# Patient Record
Sex: Female | Born: 1945 | Race: White | Hispanic: No | Smoking: Never smoker
Health system: Southern US, Community
[De-identification: ages and names within clinical notes are randomized; demographics above are authoritative.]

## PROBLEM LIST (undated history)

## (undated) DIAGNOSIS — I10 Essential (primary) hypertension: Secondary | ICD-10-CM

## (undated) DIAGNOSIS — K219 Gastro-esophageal reflux disease without esophagitis: Secondary | ICD-10-CM

## (undated) DIAGNOSIS — M810 Age-related osteoporosis without current pathological fracture: Secondary | ICD-10-CM

## (undated) DIAGNOSIS — I739 Peripheral vascular disease, unspecified: Secondary | ICD-10-CM

## (undated) DIAGNOSIS — Z9889 Other specified postprocedural states: Secondary | ICD-10-CM

## (undated) DIAGNOSIS — F439 Reaction to severe stress, unspecified: Secondary | ICD-10-CM

## (undated) DIAGNOSIS — E785 Hyperlipidemia, unspecified: Secondary | ICD-10-CM

## (undated) DIAGNOSIS — R112 Nausea with vomiting, unspecified: Secondary | ICD-10-CM

## (undated) HISTORY — DX: Gastro-esophageal reflux disease without esophagitis: K21.9

## (undated) HISTORY — PX: PARTIAL HYSTERECTOMY: SHX80

## (undated) HISTORY — DX: Hyperlipidemia, unspecified: E78.5

## (undated) HISTORY — PX: BREAST EXCISIONAL BIOPSY: SUR124

## (undated) HISTORY — DX: Essential (primary) hypertension: I10

## (undated) HISTORY — PX: BREAST BIOPSY: SHX20

## (undated) HISTORY — PX: DILATION AND CURETTAGE, DIAGNOSTIC / THERAPEUTIC: SUR384

## (undated) HISTORY — PX: APPENDECTOMY: SHX54

## (undated) HISTORY — DX: Age-related osteoporosis without current pathological fracture: M81.0

## (undated) HISTORY — DX: Reaction to severe stress, unspecified: F43.9

## (undated) HISTORY — PX: CHOLECYSTECTOMY: SHX55

## (undated) HISTORY — PX: TOTAL KNEE ARTHROPLASTY: SHX125

## (undated) HISTORY — DX: Peripheral vascular disease, unspecified: I73.9

---

## 2003-06-12 ENCOUNTER — Emergency Department (HOSPITAL_COMMUNITY): Admission: EM | Admit: 2003-06-12 | Discharge: 2003-06-12 | Payer: Self-pay | Admitting: Emergency Medicine

## 2003-07-22 ENCOUNTER — Emergency Department (HOSPITAL_COMMUNITY): Admission: EM | Admit: 2003-07-22 | Discharge: 2003-07-22 | Payer: Self-pay | Admitting: Emergency Medicine

## 2005-10-30 ENCOUNTER — Inpatient Hospital Stay (HOSPITAL_COMMUNITY): Admission: RE | Admit: 2005-10-30 | Discharge: 2005-11-03 | Payer: Self-pay | Admitting: Orthopedic Surgery

## 2013-01-14 ENCOUNTER — Ambulatory Visit: Payer: Medicare PPO | Admitting: Physical Therapy

## 2014-06-17 ENCOUNTER — Encounter: Payer: Self-pay | Admitting: *Deleted

## 2014-06-29 ENCOUNTER — Other Ambulatory Visit (HOSPITAL_COMMUNITY): Payer: Self-pay | Admitting: Family Medicine

## 2014-06-29 DIAGNOSIS — Z1231 Encounter for screening mammogram for malignant neoplasm of breast: Secondary | ICD-10-CM

## 2014-07-03 ENCOUNTER — Ambulatory Visit (HOSPITAL_COMMUNITY)
Admission: RE | Admit: 2014-07-03 | Discharge: 2014-07-03 | Disposition: A | Discharge status: Home / Self Care | Payer: Medicare PPO | Source: Ambulatory Visit | Attending: Family Medicine | Admitting: Family Medicine

## 2014-07-03 DIAGNOSIS — Z1231 Encounter for screening mammogram for malignant neoplasm of breast: Secondary | ICD-10-CM | POA: Diagnosis present

## 2014-09-01 DIAGNOSIS — E780 Pure hypercholesterolemia: Secondary | ICD-10-CM | POA: Diagnosis not present

## 2014-09-01 DIAGNOSIS — K219 Gastro-esophageal reflux disease without esophagitis: Secondary | ICD-10-CM | POA: Diagnosis not present

## 2014-09-01 DIAGNOSIS — I10 Essential (primary) hypertension: Secondary | ICD-10-CM | POA: Diagnosis not present

## 2014-10-05 DIAGNOSIS — R7309 Other abnormal glucose: Secondary | ICD-10-CM | POA: Diagnosis not present

## 2014-12-15 ENCOUNTER — Other Ambulatory Visit: Payer: Self-pay

## 2014-12-15 DIAGNOSIS — Z1231 Encounter for screening mammogram for malignant neoplasm of breast: Secondary | ICD-10-CM

## 2015-07-06 ENCOUNTER — Ambulatory Visit
Admission: RE | Admit: 2015-07-06 | Discharge: 2015-07-06 | Disposition: A | Discharge status: Home / Self Care | Payer: Medicare Other | Source: Ambulatory Visit

## 2015-07-06 DIAGNOSIS — Z1231 Encounter for screening mammogram for malignant neoplasm of breast: Secondary | ICD-10-CM

## 2016-08-16 ENCOUNTER — Other Ambulatory Visit: Payer: Self-pay | Admitting: Family Medicine

## 2016-08-16 DIAGNOSIS — Z1231 Encounter for screening mammogram for malignant neoplasm of breast: Secondary | ICD-10-CM

## 2016-09-04 ENCOUNTER — Ambulatory Visit
Admission: RE | Admit: 2016-09-04 | Discharge: 2016-09-04 | Disposition: A | Discharge status: Home / Self Care | Payer: Medicare Other | Source: Ambulatory Visit | Attending: Family Medicine | Admitting: Family Medicine

## 2016-09-04 DIAGNOSIS — Z1231 Encounter for screening mammogram for malignant neoplasm of breast: Secondary | ICD-10-CM

## 2017-10-10 ENCOUNTER — Other Ambulatory Visit: Payer: Self-pay | Admitting: Family Medicine

## 2017-10-10 DIAGNOSIS — Z1231 Encounter for screening mammogram for malignant neoplasm of breast: Secondary | ICD-10-CM

## 2017-11-09 ENCOUNTER — Ambulatory Visit
Admission: RE | Admit: 2017-11-09 | Discharge: 2017-11-09 | Disposition: A | Discharge status: Home / Self Care | Payer: Medicare Other | Source: Ambulatory Visit | Attending: Family Medicine | Admitting: Family Medicine

## 2017-11-09 DIAGNOSIS — Z1231 Encounter for screening mammogram for malignant neoplasm of breast: Secondary | ICD-10-CM

## 2018-01-07 ENCOUNTER — Other Ambulatory Visit: Payer: Self-pay | Admitting: Surgical

## 2018-01-07 DIAGNOSIS — M25551 Pain in right hip: Secondary | ICD-10-CM

## 2018-02-01 ENCOUNTER — Ambulatory Visit
Admission: RE | Admit: 2018-02-01 | Discharge: 2018-02-01 | Disposition: A | Discharge status: Home / Self Care | Payer: Medicare Other | Source: Ambulatory Visit | Attending: Surgical | Admitting: Surgical

## 2018-02-01 DIAGNOSIS — M25551 Pain in right hip: Secondary | ICD-10-CM

## 2018-02-01 MED ORDER — METHYLPREDNISOLONE ACETATE 40 MG/ML INJ SUSP (RADIOLOG
120.0000 mg | Freq: Once | INTRAMUSCULAR | Status: AC
  Administered 2018-02-01: 120 mg via INTRA_ARTICULAR

## 2018-02-01 MED ORDER — IOPAMIDOL (ISOVUE-M 200) INJECTION 41%
1.0000 mL | Freq: Once | INTRAMUSCULAR | Status: AC
  Administered 2018-02-01: 1 mL via INTRA_ARTICULAR

## 2018-02-01 NOTE — Discharge Instructions (Signed)

## 2018-08-26 NOTE — H&P (Signed)
TOTAL HIP ADMISSION H&P  Patient is admitted for right total hip arthroplasty.  Subjective:  Chief Complaint: right hip pain  HPI: Lisa Ward, 73 y.o. female, has a history of pain and functional disability in the right hip(s) due to arthritis and patient has failed non-surgical conservative treatments for greater than 12 weeks to include corticosteriod injections, use of assistive devices and activity modification.  Onset of symptoms was gradual starting 6 years ago with gradually worsening course since that time.The patient noted no past surgery on the right hip(s).  Patient currently rates pain in the right hip at 10 out of 10 with activity. Patient has worsening of pain with activity and weight bearing, pain that interfers with activities of daily living and instability. 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 She has erosion of some of the femoral head and some of the acetabulum, there are subchondral cysts present by imaging studies. This condition presents safety issues increasing the risk of falls. There is no current active infection.  There are no active problems to display for this patient.  Past Medical History:  Diagnosis Date  . Esophageal reflux   . HLD (hyperlipidemia)   . HTN (hypertension)   . Osteoporosis   . Situational stress     Past Surgical History:  Procedure Laterality Date  . APPENDECTOMY    . BREAST BIOPSY Left   . BREAST EXCISIONAL BIOPSY Left    benign  . CESAREAN SECTION     with last pregnancy  . CHOLECYSTECTOMY    . DILATION AND CURETTAGE, DIAGNOSTIC / THERAPEUTIC     x2  . PARTIAL HYSTERECTOMY     ovaries intact  . TOTAL KNEE ARTHROPLASTY Right     No current facility-administered medications for this encounter.    No current outpatient medications on file.   Not on File  Social History   Tobacco Use  . Smoking status: Not on file  Substance Use Topics  . Alcohol use: Not on  file    Family History  Problem Relation Age of Onset  . Hypertension Mother   . Dementia Mother   . Heart disease Father   . Lung cancer Father   . Heart disease Unknown   . Diabetes Unknown   . Cancer Unknown   . Arthritis Unknown      Review of Systems  Constitutional: Negative for chills and fever.  HENT: Negative for congestion, sore throat and tinnitus.   Eyes: Negative for double vision, photophobia and pain.  Respiratory: Negative for cough, shortness of breath and wheezing.   Cardiovascular: Negative for chest pain, palpitations and orthopnea.  Gastrointestinal: Negative for heartburn, nausea and vomiting.  Genitourinary: Negative for dysuria, frequency and urgency.  Musculoskeletal: Positive for joint pain.  Neurological: Negative for dizziness, weakness and headaches.    Objective:  Physical Exam  Well nourished and well developed.  General: Alert and oriented x3, cooperative and pleasant, no acute distress.  Head: normocephalic, atraumatic, neck supple.  Eyes: EOMI.  Respiratory: breath sounds clear in all fields, no wheezing, rales, or rhonchi. Cardiovascular: Regular rate and rhythm, no murmurs, gallops or rubs.  Abdomen: non-tender to palpation and soft, normoactive bowel sounds. Musculoskeletal:  Right Hip Exam: The range of motion: She has essentially almost no movement. Flexion to 90 degrees, Internal Rotation 0 degrees, External Rotation to 0 degrees, and abduction to 0 degrees with discomfort.   Calves soft and nontender. Motor function intact in  LE. Strength 5/5 LE bilaterally. Neuro: Distal pulses 2+. Sensation to light touch intact in LE.   Vital signs in last 24 hours: Blood pressure: 116/84 mmHg Pulse: 84 bpm  Labs:   There is no height or weight on file to calculate BMI.   Imaging Review Plain radiographs demonstrate severe degenerative joint disease of the right hip(s). The bone quality appears to be adequate for age and reported  activity level.   Assessment/Plan:  End stage arthritis, right hip(s)  The patient history, physical examination, clinical judgement of the provider and imaging studies are consistent with end stage degenerative joint disease of the right hip(s) and total hip arthroplasty is deemed medically necessary. The treatment options including medical management, injection therapy, arthroscopy and arthroplasty were discussed at length. The risks and benefits of total hip arthroplasty were presented and reviewed. The risks due to aseptic loosening, infection, stiffness, dislocation/subluxation,  thromboembolic complications and other imponderables were discussed.  The patient acknowledged the explanation, agreed to proceed with the plan and consent was signed. Patient is being admitted for inpatient treatment for surgery, pain control, PT, OT, prophylactic antibiotics, VTE prophylaxis, progressive ambulation and ADL's and discharge planning.The patient is planning to be discharged home.   Anticipated LOS equal to or greater than 2 midnights due to - Age 75 and older with one or more of the following:  - Obesity  - Expected need for hospital services (PT, OT, Nursing) required for safe  discharge  - Anticipated need for postoperative skilled nursing care or inpatient rehab  - Active co-morbidities: None OR   - Unanticipated findings during/Post Surgery: None  - Patient is a high risk of re-admission due to: None    Therapy Plans: HEP Disposition: Home with daughter-in-law Planned DVT Prophylaxis: Aspirin 325 mg BID DME needed: None PCP: Merri Brunetteandace Smith, MD TXA: IV Allergies: NKDA Anesthesia Concerns: Nausea/vomiting BMI: 32.3 Other: Pt has appointment with Dr. Katrinka BlazingSmith on 07/21 for preoperative clearance.  - Patient was instructed on what medications to stop prior to surgery. - Follow-up visit in 2 weeks with Dr. Lequita HaltAluisio - Begin physical therapy following surgery - Pre-operative lab work as  pre-surgical testing - Prescriptions will be provided in hospital at time of discharge  Arther AbbottKristie Iyonnah Ferrante, PA-C Orthopedic Surgery [facility] [place]

## 2018-09-07 ENCOUNTER — Other Ambulatory Visit (HOSPITAL_COMMUNITY)
Admission: RE | Admit: 2018-09-07 | Discharge: 2018-09-07 | Disposition: A | Discharge status: Home / Self Care | Payer: Medicare Other | Source: Ambulatory Visit | Attending: Orthopedic Surgery | Admitting: Orthopedic Surgery

## 2018-09-07 DIAGNOSIS — Z01812 Encounter for preprocedural laboratory examination: Secondary | ICD-10-CM | POA: Insufficient documentation

## 2018-09-07 DIAGNOSIS — Z20828 Contact with and (suspected) exposure to other viral communicable diseases: Secondary | ICD-10-CM | POA: Insufficient documentation

## 2018-09-07 LAB — SARS CORONAVIRUS 2 (TAT 6-24 HRS): SARS Coronavirus 2: NEGATIVE

## 2018-09-09 ENCOUNTER — Other Ambulatory Visit (HOSPITAL_COMMUNITY): Payer: Self-pay | Admitting: *Deleted

## 2018-09-09 NOTE — Patient Instructions (Addendum)
YOU HAD  A COVID 19 TEST ON 09-07-2018. ONCE YOUR COVID TEST IS COMPLETED, PLEASE BEGIN THE QUARANTINE INSTRUCTIONS AS OUTLINED IN YOUR HANDOUT.                 Lisa Ward     Your procedure is scheduled on: 09-11-2018   Report to [facility] Main  Entrance    Report to admitting at 11:10 AM   1 VISITOR IS ALLOWED TO WAIT IN WAITING ROOM  ONLY DAY OF YOUR SURGERY.    Call this number if you have problems the morning of surgery [phone]    Remember: [place], NO CHEWING GUM CANDY OR MINTS.    NO SOLID FOOD AFTER MIDNIGHT THE NIGHT PRIOR TO SURGERY. NOTHING BY MOUTH EXCEPT CLEAR LIQUIDS UNTIL 10:40 AM.  PLEASE FINISH ENSURE DRINK PER SURGEON ORDER 3 HOURS PRIOR TO SCHEDULED SURGERY TIME WHICH NEEDS TO BE COMPLETED AT 10:40 AM.    CLEAR LIQUID DIET   Foods Allowed                                                                     Foods Excluded  Coffee and tea, regular and decaf                             liquids that you cannot  Plain Jell-O any favor except red or purple                                           see through such as: Fruit ices (not with fruit pulp)                                     milk, soups, orange juice  Iced Popsicles                                    All solid food Carbonated beverages, regular and diet                                    Cranberry, grape and apple juices Sports drinks like Gatorade Lightly seasoned clear broth or consume(fat free) Sugar, honey syrup  Sample Menu Breakfast                                Lunch                                     Supper Cranberry juice                    Beef broth  Chicken broth Jell-O                                     Grape juice                           Apple juice Coffee or tea                        Jell-O                                      Popsicle                                                 Coffee or tea                        Coffee or tea  _____________________________________________________________________     Take these medicines the morning of surgery with A SIP OF WATER:  OMEPRAZOLE, ATORVASTATIN, TYLENOL IF NEEDED                                 You may not have any metal on your body including hair pins and              piercings  Do not wear jewelry, make-up, lotions, powders or perfumes, deodorant             Do not wear nail polish.  Do not shave  48 hours prior to surgery.               Do not bring valuables to the hospital. [facility] IS NOT             RESPONSIBLE   FOR VALUABLES.  Contacts, dentures or bridgework may not be worn into surgery.  Leave suitcase in the car. After surgery it may be brought to your room.                 Please read over the following fact sheets you were given: _____________________________________________________________________             [facility] - Preparing for Surgery Before surgery, you can play an important role.  Because skin is not sterile, your skin needs to be as free of germs as possible.  You can reduce the number of germs on your skin by washing with CHG (chlorahexidine gluconate) soap before surgery.  CHG is an antiseptic cleaner which kills germs and bonds with the skin to continue killing germs even after washing. Please DO NOT use if you have an allergy to CHG or antibacterial soaps.  If your skin becomes reddened/irritated stop using the CHG and inform your nurse when you arrive at Short Stay. Do not shave (including legs and underarms) for at least 48 hours prior to the first CHG shower.  You may shave your face/neck. Please follow these instructions carefully:  1.  Shower with CHG Soap the night before surgery and the  morning of Surgery.  2.  If you choose to wash your  hair, wash your hair first as usual with your  normal  shampoo.  3.  After you shampoo, rinse your hair and body thoroughly to  remove the  shampoo.                           4.  Use CHG as you would any other liquid soap.  You can apply chg directly  to the skin and wash                       Gently with a scrungie or clean washcloth.  5.  Apply the CHG Soap to your body ONLY FROM THE NECK DOWN.   Do not use on face/ open                           Wound or open sores. Avoid contact with eyes, ears mouth and genitals (private parts).                       Wash face,  Genitals (private parts) with your normal soap.             6.  Wash thoroughly, paying special attention to the area where your surgery  will be performed.  7.  Thoroughly rinse your body with warm water from the neck down.  8.  DO NOT shower/wash with your normal soap after using and rinsing off  the CHG Soap.                9.  Pat yourself dry with a clean towel.            10.  Wear clean pajamas.            11.  Place clean sheets on your bed the night of your first shower and do not  sleep with pets. Day of Surgery : Do not apply any lotions/deodorants the morning of surgery.  Please wear clean clothes to the hospital/surgery center.  FAILURE TO FOLLOW THESE INSTRUCTIONS MAY RESULT IN THE CANCELLATION OF YOUR SURGERY PATIENT SIGNATURE_________________________________  NURSE SIGNATURE__________________________________  ________________________________________________________________________   Lisa MireIncentive Spirometer  An incentive spirometer is a tool that can help keep your lungs clear and active. This tool measures how well you are filling your lungs with each breath. Taking long deep breaths may help reverse or decrease the chance of developing breathing (pulmonary) problems (especially infection) following:  A long period of time when you are unable to move or be active. BEFORE THE PROCEDURE   If the spirometer includes an indicator to show your best effort, your nurse or respiratory therapist will set it to a desired goal.  If possible, sit  up straight or lean slightly forward. Try not to slouch.  Hold the incentive spirometer in an upright position. INSTRUCTIONS FOR USE  1. Sit on the edge of your bed if possible, or sit up as far as you can in bed or on a chair. 2. Hold the incentive spirometer in an upright position. 3. Breathe out normally. 4. Place the mouthpiece in your mouth and seal your lips tightly around it. 5. Breathe in slowly and as deeply as possible, raising the piston or the ball toward the top of the column. 6. Hold your breath for 3-5 seconds or for as long as possible. Allow the piston or ball to fall to the bottom of  the column. 7. Remove the mouthpiece from your mouth and breathe out normally. 8. Rest for a few seconds and repeat Steps 1 through 7 at least 10 times every 1-2 hours when you are awake. Take your time and take a few normal breaths between deep breaths. 9. The spirometer may include an indicator to show your best effort. Use the indicator as a goal to work toward during each repetition. 10. After each set of 10 deep breaths, practice coughing to be sure your lungs are clear. If you have an incision (the cut made at the time of surgery), support your incision when coughing by placing a pillow or rolled up towels firmly against it. Once you are able to get out of bed, walk around indoors and cough well. You may stop using the incentive spirometer when instructed by your caregiver.  RISKS AND COMPLICATIONS  Take your time so you do not get dizzy or light-headed.  If you are in pain, you may need to take or ask for pain medication before doing incentive spirometry. It is harder to take a deep breath if you are having pain. AFTER USE  Rest and breathe slowly and easily.  It can be helpful to keep track of a log of your progress. Your caregiver can provide you with a simple table to help with this. If you are using the spirometer at home, follow these instructions: SEEK MEDICAL CARE IF:   You are  having difficultly using the spirometer.  You have trouble using the spirometer as often as instructed.  Your pain medication is not giving enough relief while using the spirometer.  You develop fever of 100.5 F (38.1 C) or higher. SEEK IMMEDIATE MEDICAL CARE IF:   You cough up bloody sputum that had not been present before.  You develop fever of 102 F (38.9 C) or greater.  You develop worsening pain at or near the incision site. MAKE SURE YOU:   Understand these instructions.  Will watch your condition.  Will get help right away if you are not doing well or get worse. Document Released: 06/05/2006 Document Revised: 04/17/2011 Document Reviewed: 08/06/2006 ExitCare Patient Information 2014 [employer], [place].   ________________________________________________________________________  WHAT IS A BLOOD TRANSFUSION? Blood Transfusion Information  A transfusion is the replacement of blood or some of its parts. Blood is made up of multiple cells which provide different functions.  Red blood cells carry oxygen and are used for blood loss replacement.  White blood cells fight against infection.  Platelets control bleeding.  Plasma helps clot blood.  Other blood products are available for specialized needs, such as hemophilia or other clotting disorders. BEFORE THE TRANSFUSION  Who gives blood for transfusions?   Healthy volunteers who are fully evaluated to make sure their blood is safe. This is blood bank blood. Transfusion therapy is the safest it has ever been in the practice of medicine. Before blood is taken from a donor, a complete history is taken to make sure that person has no history of diseases nor engages in risky social behavior (examples are intravenous drug use or sexual activity with multiple partners). The donor's travel history is screened to minimize risk of transmitting infections, such as malaria. The donated blood is tested for signs of infectious diseases,  such as HIV and hepatitis. The blood is then tested to be sure it is compatible with you in order to minimize the chance of a transfusion reaction. If you or a relative donates blood, this is often  done in anticipation of surgery and is not appropriate for emergency situations. It takes many days to process the donated blood. RISKS AND COMPLICATIONS Although transfusion therapy is very safe and saves many lives, the main dangers of transfusion include:   Getting an infectious disease.  Developing a transfusion reaction. This is an allergic reaction to something in the blood you were given. Every precaution is taken to prevent this. The decision to have a blood transfusion has been considered carefully by your caregiver before blood is given. Blood is not given unless the benefits outweigh the risks. AFTER THE TRANSFUSION  Right after receiving a blood transfusion, you will usually feel much better and more energetic. This is especially true if your red blood cells have gotten low (anemic). The transfusion raises the level of the red blood cells which carry oxygen, and this usually causes an energy increase.  The nurse administering the transfusion will monitor you carefully for complications. HOME CARE INSTRUCTIONS  No special instructions are needed after a transfusion. You may find your energy is better. Speak with your caregiver about any limitations on activity for underlying diseases you may have. SEEK MEDICAL CARE IF:   Your condition is not improving after your transfusion.  You develop redness or irritation at the intravenous (IV) site. SEEK IMMEDIATE MEDICAL CARE IF:  Any of the following symptoms occur over the next 12 hours:  Shaking chills.  You have a temperature by mouth above 102 F (38.9 C), not controlled by medicine.  Chest, back, or muscle pain.  People around you feel you are not acting correctly or are confused.  Shortness of breath or difficulty  breathing.  Dizziness and fainting.  You get a rash or develop hives.  You have a decrease in urine output.  Your urine turns a dark color or changes to pink, red, or brown. Any of the following symptoms occur over the next 10 days:  You have a temperature by mouth above 102 F (38.9 C), not controlled by medicine.  Shortness of breath.  Weakness after normal activity.  The white part of the eye turns yellow (jaundice).  You have a decrease in the amount of urine or are urinating less often.  Your urine turns a dark color or changes to pink, red, or brown. Document Released: 01/21/2000 Document Revised: 04/17/2011 Document Reviewed: 09/09/2007 [facility] Patient Information 2014 [place], [place].  _______________________________________________________________________

## 2018-09-10 ENCOUNTER — Encounter (HOSPITAL_COMMUNITY)
Admission: RE | Admit: 2018-09-10 | Discharge: 2018-09-10 | Disposition: A | Discharge status: Home / Self Care | Payer: Medicare Other | Source: Ambulatory Visit | Attending: Orthopedic Surgery | Admitting: Orthopedic Surgery

## 2018-09-10 ENCOUNTER — Encounter (HOSPITAL_COMMUNITY): Payer: Self-pay

## 2018-09-10 ENCOUNTER — Other Ambulatory Visit: Payer: Self-pay

## 2018-09-10 HISTORY — DX: Nausea with vomiting, unspecified: R11.2

## 2018-09-10 HISTORY — DX: Other specified postprocedural states: Z98.890

## 2018-09-10 LAB — COMPREHENSIVE METABOLIC PANEL
ALT: 11 U/L (ref 0–44)
AST: 12 U/L — ABNORMAL LOW (ref 15–41)
Albumin: 4.2 g/dL (ref 3.5–5.0)
Alkaline Phosphatase: 74 U/L (ref 38–126)
Anion gap: 11 (ref 5–15)
BUN: 17 mg/dL (ref 8–23)
CO2: 27 mmol/L (ref 22–32)
Calcium: 10.2 mg/dL (ref 8.9–10.3)
Chloride: 100 mmol/L (ref 98–111)
Creatinine, Ser: 0.58 mg/dL (ref 0.44–1.00)
GFR calc Af Amer: >60 mL/min (ref >60)
GFR calc non Af Amer: >60 mL/min (ref >60)
Glucose, Bld: 108 mg/dL — ABNORMAL HIGH (ref 70–99)
Potassium: 3.3 mmol/L — ABNORMAL LOW (ref 3.5–5.1)
Sodium: 138 mmol/L (ref 135–145)
Total Bilirubin: 1.4 mg/dL — ABNORMAL HIGH (ref 0.3–1.2)
Total Protein: 8 g/dL (ref 6.5–8.1)

## 2018-09-10 LAB — CBC
HCT: 42.8 % (ref 36.0–46.0)
Hemoglobin: 14 g/dL (ref 12.0–15.0)
MCH: 29.9 pg (ref 26.0–34.0)
MCHC: 32.7 g/dL (ref 30.0–36.0)
MCV: 91.3 fL (ref 80.0–100.0)
Platelets: 316 10*3/uL (ref 150–400)
RBC: 4.69 MIL/uL (ref 3.87–5.11)
RDW: 14.2 % (ref 11.5–15.5)
WBC: 11 10*3/uL — ABNORMAL HIGH (ref 4.0–10.5)
nRBC: 0 % (ref 0.0–0.2)

## 2018-09-10 LAB — SURGICAL PCR SCREEN
MRSA, PCR: NEGATIVE
Staphylococcus aureus: NEGATIVE

## 2018-09-10 LAB — PROTIME-INR
INR: 0.9 (ref 0.8–1.2)
Prothrombin Time: 12.4 seconds (ref 11.4–15.2)

## 2018-09-10 LAB — APTT: aPTT: 30 seconds (ref 24–36)

## 2018-09-10 NOTE — Progress Notes (Signed)
SURGICAL CLEARANCE AND LOV NOTES , DR CANDACE SMITH ON CHART 08-27-2018  EKG 08-27-2018 ON CHART FROM [facility].   LABS :HGBA1C, CMP,CBCDIFF, LIPIDS 12-19-17 ON CHAR FROM [facility].

## 2018-09-11 ENCOUNTER — Encounter (HOSPITAL_COMMUNITY)
Admission: RE | Disposition: A | Discharge status: Home / Self Care | Payer: Self-pay | Source: Home / Self Care | Attending: Orthopedic Surgery

## 2018-09-11 ENCOUNTER — Encounter (HOSPITAL_COMMUNITY): Payer: Self-pay | Admitting: *Deleted

## 2018-09-11 ENCOUNTER — Inpatient Hospital Stay (HOSPITAL_COMMUNITY): Payer: Medicare Other

## 2018-09-11 ENCOUNTER — Inpatient Hospital Stay (HOSPITAL_COMMUNITY): Payer: Medicare Other | Admitting: Physician Assistant

## 2018-09-11 ENCOUNTER — Inpatient Hospital Stay (HOSPITAL_COMMUNITY)
Admission: RE | Admit: 2018-09-11 | Discharge: 2018-09-12 | DRG: 470 | Disposition: A | Discharge status: Home / Self Care | Payer: Medicare Other | Source: Ambulatory Visit | Attending: Orthopedic Surgery | Admitting: Orthopedic Surgery

## 2018-09-11 ENCOUNTER — Inpatient Hospital Stay (HOSPITAL_COMMUNITY): Payer: Medicare Other | Admitting: Certified Registered"

## 2018-09-11 DIAGNOSIS — M25551 Pain in right hip: Secondary | ICD-10-CM | POA: Diagnosis present

## 2018-09-11 DIAGNOSIS — Z8249 Family history of ischemic heart disease and other diseases of the circulatory system: Secondary | ICD-10-CM

## 2018-09-11 DIAGNOSIS — Z419 Encounter for procedure for purposes other than remedying health state, unspecified: Secondary | ICD-10-CM

## 2018-09-11 DIAGNOSIS — M1611 Unilateral primary osteoarthritis, right hip: Secondary | ICD-10-CM | POA: Diagnosis present

## 2018-09-11 DIAGNOSIS — M810 Age-related osteoporosis without current pathological fracture: Secondary | ICD-10-CM | POA: Diagnosis present

## 2018-09-11 DIAGNOSIS — K219 Gastro-esophageal reflux disease without esophagitis: Secondary | ICD-10-CM | POA: Diagnosis present

## 2018-09-11 DIAGNOSIS — E785 Hyperlipidemia, unspecified: Secondary | ICD-10-CM | POA: Diagnosis present

## 2018-09-11 DIAGNOSIS — Z20828 Contact with and (suspected) exposure to other viral communicable diseases: Secondary | ICD-10-CM | POA: Diagnosis present

## 2018-09-11 DIAGNOSIS — Z96651 Presence of right artificial knee joint: Secondary | ICD-10-CM | POA: Diagnosis present

## 2018-09-11 DIAGNOSIS — Z01812 Encounter for preprocedural laboratory examination: Secondary | ICD-10-CM

## 2018-09-11 DIAGNOSIS — I10 Essential (primary) hypertension: Secondary | ICD-10-CM | POA: Diagnosis present

## 2018-09-11 DIAGNOSIS — M169 Osteoarthritis of hip, unspecified: Secondary | ICD-10-CM | POA: Diagnosis present

## 2018-09-11 DIAGNOSIS — Z96649 Presence of unspecified artificial hip joint: Secondary | ICD-10-CM

## 2018-09-11 HISTORY — PX: TOTAL HIP ARTHROPLASTY: SHX124

## 2018-09-11 LAB — TYPE AND SCREEN
ABO/RH(D): A POS
Antibody Screen: NEGATIVE

## 2018-09-11 SURGERY — ARTHROPLASTY, HIP, TOTAL, ANTERIOR APPROACH
Anesthesia: Spinal | Site: Hip | Laterality: Right

## 2018-09-11 MED ORDER — ACETAMINOPHEN 160 MG/5ML PO SOLN: 325.0000 mg | ORAL | Status: DC | PRN

## 2018-09-11 MED ORDER — CHLORHEXIDINE GLUCONATE 4 % EX LIQD: 60.0000 mL | Freq: Once | CUTANEOUS | Status: DC

## 2018-09-11 MED ORDER — MORPHINE SULFATE (PF) 2 MG/ML IV SOLN: 0.5000 mg | INTRAVENOUS | Status: DC | PRN

## 2018-09-11 MED ORDER — PROPOFOL 10 MG/ML IV BOLUS
INTRAVENOUS | Status: AC
  Filled 2018-09-11: qty 60

## 2018-09-11 MED ORDER — CEFAZOLIN SODIUM-DEXTROSE 2-4 GM/100ML-% IV SOLN
2.0000 g | Freq: Four times a day (QID) | INTRAVENOUS | Status: AC
  Administered 2018-09-11 (×2): 2 g via INTRAVENOUS
  Filled 2018-09-11 (×2): qty 100

## 2018-09-11 MED ORDER — ACETAMINOPHEN 500 MG PO TABS
500.0000 mg | ORAL_TABLET | Freq: Four times a day (QID) | ORAL | Status: DC
  Administered 2018-09-11 – 2018-09-12 (×3): 500 mg via ORAL
  Filled 2018-09-11 (×3): qty 1

## 2018-09-11 MED ORDER — OXYCODONE HCL 5 MG/5ML PO SOLN: 5.0000 mg | Freq: Once | ORAL | Status: DC | PRN

## 2018-09-11 MED ORDER — ONDANSETRON HCL 4 MG/2ML IJ SOLN
INTRAMUSCULAR | Status: DC | PRN
  Administered 2018-09-11: 4 mg via INTRAVENOUS

## 2018-09-11 MED ORDER — METOCLOPRAMIDE HCL 5 MG PO TABS
5.0000 mg | ORAL_TABLET | Freq: Three times a day (TID) | ORAL | Status: DC | PRN
  Filled 2018-09-11: qty 2

## 2018-09-11 MED ORDER — BISACODYL 10 MG RE SUPP: 10.0000 mg | Freq: Every day | RECTAL | Status: DC | PRN

## 2018-09-11 MED ORDER — SODIUM CHLORIDE 0.9 % IV SOLN: INTRAVENOUS | Status: DC | PRN

## 2018-09-11 MED ORDER — ONDANSETRON HCL 4 MG/2ML IJ SOLN: 4.0000 mg | Freq: Four times a day (QID) | INTRAMUSCULAR | Status: DC | PRN

## 2018-09-11 MED ORDER — SODIUM CHLORIDE 0.9 % IV SOLN
INTRAVENOUS | Status: DC
  Administered 2018-09-11: 19:00:00 via INTRAVENOUS

## 2018-09-11 MED ORDER — FLEET ENEMA 7-19 GM/118ML RE ENEM: 1.0000 | ENEMA | Freq: Once | RECTAL | Status: DC | PRN

## 2018-09-11 MED ORDER — POVIDONE-IODINE 10 % EX SWAB: 2.0000 "application " | Freq: Once | CUTANEOUS | Status: DC

## 2018-09-11 MED ORDER — DIPHENHYDRAMINE HCL 12.5 MG/5ML PO ELIX: 12.5000 mg | ORAL_SOLUTION | ORAL | Status: DC | PRN

## 2018-09-11 MED ORDER — ASPIRIN EC 325 MG PO TBEC
325.0000 mg | DELAYED_RELEASE_TABLET | Freq: Two times a day (BID) | ORAL | Status: DC
  Administered 2018-09-12: 325 mg via ORAL
  Filled 2018-09-11: qty 1

## 2018-09-11 MED ORDER — MEPERIDINE HCL 50 MG/ML IJ SOLN: 6.2500 mg | INTRAMUSCULAR | Status: DC | PRN

## 2018-09-11 MED ORDER — DEXAMETHASONE SODIUM PHOSPHATE 10 MG/ML IJ SOLN
8.0000 mg | Freq: Once | INTRAMUSCULAR | Status: AC
  Administered 2018-09-11: 10 mg via INTRAVENOUS

## 2018-09-11 MED ORDER — ACETAMINOPHEN 10 MG/ML IV SOLN
1000.0000 mg | Freq: Once | INTRAVENOUS | Status: AC
  Administered 2018-09-11: 1000 mg via INTRAVENOUS
  Filled 2018-09-11: qty 100

## 2018-09-11 MED ORDER — PROPOFOL 500 MG/50ML IV EMUL
INTRAVENOUS | Status: DC | PRN
  Administered 2018-09-11: 25 ug/kg/min via INTRAVENOUS

## 2018-09-11 MED ORDER — ATORVASTATIN CALCIUM 40 MG PO TABS
40.0000 mg | ORAL_TABLET | Freq: Every day | ORAL | Status: DC
  Filled 2018-09-11: qty 1

## 2018-09-11 MED ORDER — LACTATED RINGERS IV SOLN
INTRAVENOUS | Status: DC
  Administered 2018-09-11 (×2): via INTRAVENOUS

## 2018-09-11 MED ORDER — 0.9 % SODIUM CHLORIDE (POUR BTL) OPTIME
TOPICAL | Status: DC | PRN
  Administered 2018-09-11: 1000 mL

## 2018-09-11 MED ORDER — POTASSIUM GLUCONATE 595 (99 K) MG PO TABS
1190.0000 mg | ORAL_TABLET | Freq: Every day | ORAL | Status: DC
  Administered 2018-09-12: 1190 mg via ORAL
  Filled 2018-09-11: qty 2

## 2018-09-11 MED ORDER — OXYCODONE HCL 5 MG PO TABS: 5.0000 mg | ORAL_TABLET | Freq: Once | ORAL | Status: DC | PRN

## 2018-09-11 MED ORDER — PROPOFOL 10 MG/ML IV BOLUS
INTRAVENOUS | Status: DC | PRN
  Administered 2018-09-11: 30 mg via INTRAVENOUS
  Administered 2018-09-11: 25 mg via INTRAVENOUS

## 2018-09-11 MED ORDER — BUPIVACAINE-EPINEPHRINE (PF) 0.25% -1:200000 IJ SOLN
INTRAMUSCULAR | Status: AC
  Filled 2018-09-11: qty 30

## 2018-09-11 MED ORDER — FENTANYL CITRATE (PF) 100 MCG/2ML IJ SOLN: 25.0000 ug | INTRAMUSCULAR | Status: DC | PRN

## 2018-09-11 MED ORDER — LIDOCAINE 2% (20 MG/ML) 5 ML SYRINGE
INTRAMUSCULAR | Status: DC | PRN
  Administered 2018-09-11 (×2): 50 mg via INTRAVENOUS

## 2018-09-11 MED ORDER — PHENOL 1.4 % MT LIQD: 1.0000 | OROMUCOSAL | Status: DC | PRN

## 2018-09-11 MED ORDER — POLYETHYLENE GLYCOL 3350 17 G PO PACK: 17.0000 g | PACK | Freq: Every day | ORAL | Status: DC | PRN

## 2018-09-11 MED ORDER — GLYCOPYRROLATE PF 0.2 MG/ML IJ SOSY
PREFILLED_SYRINGE | INTRAMUSCULAR | Status: DC | PRN
  Administered 2018-09-11 (×2): .1 mg via INTRAVENOUS

## 2018-09-11 MED ORDER — SODIUM CHLORIDE 0.9 % IV SOLN
INTRAVENOUS | Status: DC | PRN
  Administered 2018-09-11: 10 ug/min via INTRAVENOUS

## 2018-09-11 MED ORDER — METOCLOPRAMIDE HCL 5 MG/ML IJ SOLN: 5.0000 mg | Freq: Three times a day (TID) | INTRAMUSCULAR | Status: DC | PRN

## 2018-09-11 MED ORDER — HYDROCODONE-ACETAMINOPHEN 5-325 MG PO TABS
1.0000 | ORAL_TABLET | ORAL | Status: DC | PRN
  Administered 2018-09-11 – 2018-09-12 (×2): 2 via ORAL
  Filled 2018-09-11 (×2): qty 2

## 2018-09-11 MED ORDER — BUPIVACAINE IN DEXTROSE 0.75-8.25 % IT SOLN
INTRATHECAL | Status: DC | PRN
  Administered 2018-09-11: 1.8 mL via INTRATHECAL

## 2018-09-11 MED ORDER — TRANEXAMIC ACID-NACL 1000-0.7 MG/100ML-% IV SOLN
1000.0000 mg | INTRAVENOUS | Status: AC
  Administered 2018-09-11: 12:00:00 1000 mg via INTRAVENOUS
  Filled 2018-09-11: qty 100

## 2018-09-11 MED ORDER — PHENYLEPHRINE 40 MCG/ML (10ML) SYRINGE FOR IV PUSH (FOR BLOOD PRESSURE SUPPORT)
PREFILLED_SYRINGE | INTRAVENOUS | Status: DC | PRN
  Administered 2018-09-11: 80 ug via INTRAVENOUS
  Administered 2018-09-11 (×2): 120 ug via INTRAVENOUS

## 2018-09-11 MED ORDER — DOCUSATE SODIUM 100 MG PO CAPS
100.0000 mg | ORAL_CAPSULE | Freq: Two times a day (BID) | ORAL | Status: DC
  Administered 2018-09-11 – 2018-09-12 (×2): 100 mg via ORAL
  Filled 2018-09-11 (×2): qty 1

## 2018-09-11 MED ORDER — ALBUMIN HUMAN 5 % IV SOLN
INTRAVENOUS | Status: DC | PRN
  Administered 2018-09-11: 13:00:00 via INTRAVENOUS

## 2018-09-11 MED ORDER — CEFAZOLIN SODIUM-DEXTROSE 2-4 GM/100ML-% IV SOLN
2.0000 g | INTRAVENOUS | Status: AC
  Administered 2018-09-11: 2 g via INTRAVENOUS
  Filled 2018-09-11: qty 100

## 2018-09-11 MED ORDER — TRAMADOL HCL 50 MG PO TABS
50.0000 mg | ORAL_TABLET | Freq: Four times a day (QID) | ORAL | Status: DC | PRN
  Administered 2018-09-12: 06:00:00 50 mg via ORAL
  Filled 2018-09-11: qty 1

## 2018-09-11 MED ORDER — ACETAMINOPHEN 325 MG PO TABS: 325.0000 mg | ORAL_TABLET | ORAL | Status: DC | PRN

## 2018-09-11 MED ORDER — ONDANSETRON HCL 4 MG/2ML IJ SOLN: 4.0000 mg | Freq: Once | INTRAMUSCULAR | Status: DC | PRN

## 2018-09-11 MED ORDER — METHOCARBAMOL 500 MG PO TABS
500.0000 mg | ORAL_TABLET | Freq: Four times a day (QID) | ORAL | Status: DC | PRN
  Administered 2018-09-11 – 2018-09-12 (×2): 500 mg via ORAL
  Filled 2018-09-11 (×2): qty 1

## 2018-09-11 MED ORDER — BUPIVACAINE-EPINEPHRINE (PF) 0.25% -1:200000 IJ SOLN
INTRAMUSCULAR | Status: DC | PRN
  Administered 2018-09-11: 30 mL

## 2018-09-11 MED ORDER — DEXAMETHASONE SODIUM PHOSPHATE 10 MG/ML IJ SOLN
10.0000 mg | Freq: Once | INTRAMUSCULAR | Status: AC
  Administered 2018-09-12: 10 mg via INTRAVENOUS
  Filled 2018-09-11: qty 1

## 2018-09-11 MED ORDER — ONDANSETRON HCL 4 MG PO TABS
4.0000 mg | ORAL_TABLET | Freq: Four times a day (QID) | ORAL | Status: DC | PRN
  Filled 2018-09-11: qty 1

## 2018-09-11 MED ORDER — STERILE WATER FOR IRRIGATION IR SOLN
Status: DC | PRN
  Administered 2018-09-11: 2000 mL

## 2018-09-11 MED ORDER — MENTHOL 3 MG MT LOZG: 1.0000 | LOZENGE | OROMUCOSAL | Status: DC | PRN

## 2018-09-11 MED ORDER — METHOCARBAMOL 500 MG IVPB - SIMPLE MED
500.0000 mg | Freq: Four times a day (QID) | INTRAVENOUS | Status: DC | PRN
  Filled 2018-09-11: qty 50

## 2018-09-11 SURGICAL SUPPLY — 47 items
BAG DECANTER FOR FLEXI CONT (MISCELLANEOUS) IMPLANT
BAG ZIPLOCK 12X15 (MISCELLANEOUS) IMPLANT
BLADE SAG 18X100X1.27 (BLADE) ×3 IMPLANT
CLOSURE STERI-STRIP 1/2X4 (GAUZE/BANDAGES/DRESSINGS) ×2
CLOSURE WOUND 1/2 X4 (GAUZE/BANDAGES/DRESSINGS) ×1
CLSR STERI-STRIP ANTIMIC 1/2X4 (GAUZE/BANDAGES/DRESSINGS) ×4 IMPLANT
COVER PERINEAL POST (MISCELLANEOUS) ×3 IMPLANT
COVER SURGICAL LIGHT HANDLE (MISCELLANEOUS) ×3 IMPLANT
COVER WAND RF STERILE (DRAPES) IMPLANT
CUP ACETBLR 52 OD PINNACLE (Hips) ×3 IMPLANT
DECANTER SPIKE VIAL GLASS SM (MISCELLANEOUS) ×3 IMPLANT
DRAPE STERI IOBAN 125X83 (DRAPES) ×3 IMPLANT
DRAPE U-SHAPE 47X51 STRL (DRAPES) ×6 IMPLANT
DRSG ADAPTIC 3X8 NADH LF (GAUZE/BANDAGES/DRESSINGS) ×3 IMPLANT
DRSG MEPILEX BORDER 4X4 (GAUZE/BANDAGES/DRESSINGS) ×3 IMPLANT
DRSG MEPILEX BORDER 4X8 (GAUZE/BANDAGES/DRESSINGS) ×3 IMPLANT
DURAPREP 26ML APPLICATOR (WOUND CARE) ×3 IMPLANT
ELECT REM PT RETURN 15FT ADLT (MISCELLANEOUS) ×3 IMPLANT
EVACUATOR 1/8 PVC DRAIN (DRAIN) ×3 IMPLANT
GLOVE BIO SURGEON STRL SZ 6 (GLOVE) IMPLANT
GLOVE BIO SURGEON STRL SZ7 (GLOVE) IMPLANT
GLOVE BIO SURGEON STRL SZ8 (GLOVE) ×3 IMPLANT
GLOVE BIOGEL PI IND STRL 6.5 (GLOVE) IMPLANT
GLOVE BIOGEL PI IND STRL 7.0 (GLOVE) IMPLANT
GLOVE BIOGEL PI IND STRL 8 (GLOVE) ×1 IMPLANT
GLOVE BIOGEL PI INDICATOR 6.5 (GLOVE)
GLOVE BIOGEL PI INDICATOR 7.0 (GLOVE)
GLOVE BIOGEL PI INDICATOR 8 (GLOVE) ×2
GOWN STRL REUS W/TWL LRG LVL3 (GOWN DISPOSABLE) ×3 IMPLANT
GOWN STRL REUS W/TWL XL LVL3 (GOWN DISPOSABLE) IMPLANT
HEAD FEM STD 32X+1 STRL (Hips) ×3 IMPLANT
HOLDER FOLEY CATH W/STRAP (MISCELLANEOUS) ×3 IMPLANT
KIT TURNOVER KIT A (KITS) IMPLANT
LINER MARATHON NEUT +4X52X32 (Hips) ×3 IMPLANT
MANIFOLD NEPTUNE II (INSTRUMENTS) ×3 IMPLANT
PACK ANTERIOR HIP CUSTOM (KITS) ×3 IMPLANT
STEM FEM ACTIS HIGH SZ10 (Stem) ×3 IMPLANT
STRIP CLOSURE SKIN 1/2X4 (GAUZE/BANDAGES/DRESSINGS) ×2 IMPLANT
SUT ETHIBOND NAB CT1 #1 30IN (SUTURE) ×3 IMPLANT
SUT MNCRL AB 4-0 PS2 18 (SUTURE) ×3 IMPLANT
SUT STRATAFIX 0 PDS 27 VIOLET (SUTURE) ×3
SUT VIC AB 2-0 CT1 27 (SUTURE) ×4
SUT VIC AB 2-0 CT1 TAPERPNT 27 (SUTURE) ×2 IMPLANT
SUTURE STRATFX 0 PDS 27 VIOLET (SUTURE) ×1 IMPLANT
SYR 50ML LL SCALE MARK (SYRINGE) IMPLANT
TRAY CATH 16FR W/PLASTIC CATH (SET/KITS/TRAYS/PACK) ×3 IMPLANT
YANKAUER SUCT BULB TIP 10FT TU (MISCELLANEOUS) ×3 IMPLANT

## 2018-09-11 NOTE — Interval H&P Note (Signed)
History and Physical Interval Note:  09/11/2018 11:10 AM  Lisa Ward  has presented today for surgery, with the diagnosis of right hip osteoarthritis.  The various methods of treatment have been discussed with the patient and family. After consideration of risks, benefits and other options for treatment, the patient has consented to  Procedure(s) with comments: [place] (Right) - 155min as a surgical intervention.  The patient's history has been reviewed, patient examined, no change in status, stable for surgery.  I have reviewed the patient's chart and labs.  Questions were answered to the patient's satisfaction.     [name] [name]

## 2018-09-11 NOTE — Progress Notes (Signed)
Patient arrived to unit alert and oriented. Patient oriented to room and equipment. No complaints of pain. Will continue to monitor.

## 2018-09-11 NOTE — Transfer of Care (Signed)
Immediate Anesthesia Transfer of Care Note  Patient: Lisa Ward  Procedure(s) Performed: TOTAL HIP ARTHROPLASTY ANTERIOR APPROACH (Right Hip)  Patient Location: PACU  Anesthesia Type:MAC and Spinal  Level of Consciousness: awake and patient cooperative  Airway & Oxygen Therapy: Patient Spontanous Breathing and Patient connected to face mask oxygen  Post-op Assessment: Report given to RN and Post -op Vital signs reviewed and stable  Post vital signs: Reviewed and stable  Last Vitals:  Vitals Value Taken Time  BP 100/63 09/11/18 1408  Temp    Pulse 76 09/11/18 1412  Resp 17 09/11/18 1412  SpO2 100 % 09/11/18 1412  Vitals shown include unvalidated device data.  Last Pain:  Vitals:   09/11/18 1135  PainSc: 0-No pain         Complications: No apparent anesthesia complications

## 2018-09-11 NOTE — Discharge Instructions (Signed)
°Dr. Frank Aluisio °Total Joint Specialist °[facility] °[address] °[place],  [address] °[phone] ° °ANTERIOR APPROACH TOTAL HIP REPLACEMENT POSTOPERATIVE DIRECTIONS ° ° °Hip Rehabilitation, Guidelines Following Surgery  °The results of a hip operation are greatly improved after range of motion and muscle strengthening exercises. Follow all safety measures which are given to protect your hip. If any of these exercises cause increased pain or swelling in your joint, decrease the amount until you are comfortable again. Then slowly increase the exercises. Call your caregiver if you have problems or questions.  ° °HOME CARE INSTRUCTIONS  °• Remove items at home which could result in a fall. This includes throw rugs or furniture in walking pathways.  °· ICE to the affected hip every three hours for 30 minutes at a time and then as needed for pain and swelling.  Continue to use ice on the hip for pain and swelling from surgery. You may notice swelling that will progress down to the foot and ankle.  This is normal after surgery.  Elevate the leg when you are not up walking on it.   °· Continue to use the breathing machine which will help keep your temperature down.  It is common for your temperature to cycle up and down following surgery, especially at night when you are not up moving around and exerting yourself.  The breathing machine keeps your lungs expanded and your temperature down. ° °DIET °You may resume your previous home diet once your are discharged from the hospital. ° °DRESSING / WOUND CARE / SHOWERING °You may change your dressing 3-5 days after surgery.  Then change the dressing every day with sterile gauze.  Please use good hand washing techniques before changing the dressing.  Do not use any lotions or creams on the incision until instructed by your surgeon. °You may start showering once you are discharged home but do not submerge the incision under water. Just pat the  incision dry and apply a dry gauze dressing on daily. °Change the surgical dressing daily and reapply a dry dressing each time. ° °ACTIVITY °Walk with your walker as instructed. °Use walker as long as suggested by your caregivers. °Avoid periods of inactivity such as sitting longer than an hour when not asleep. This helps prevent blood clots.  °You may resume a sexual relationship in one month or when given the OK by your doctor.  °You may return to work once you are cleared by your doctor.  °Do not drive a car for 6 weeks or until released by you surgeon.  °Do not drive while taking narcotics. ° °WEIGHT BEARING °Weight bearing as tolerated with assist device (walker, cane, etc) as directed, use it as long as suggested by your surgeon or therapist, typically at least 4-6 weeks. ° °POSTOPERATIVE CONSTIPATION PROTOCOL °Constipation - defined medically as fewer than three stools per week and severe constipation as less than one stool per week. ° °One of the most common issues patients have following surgery is constipation.  Even if you have a regular bowel pattern at home, your normal regimen is likely to be disrupted due to multiple reasons following surgery.  Combination of anesthesia, postoperative narcotics, change in appetite and fluid intake all can affect your bowels.  In order to avoid complications following surgery, here are some recommendations in order to help you during your recovery period. ° °Colace (docusate) - Pick up an over-the-counter form of Colace or another stool softener and take twice a day   as long as you are requiring postoperative pain medications.  Take with a full glass of water daily.  If you experience loose stools or diarrhea, hold the colace until you stool forms back up.  If your symptoms do not get better within 1 week or if they get worse, check with your doctor. ° °Dulcolax (bisacodyl) - Pick up over-the-counter and take as directed by the product packaging as needed to assist with  the movement of your bowels.  Take with a full glass of water.  Use this product as needed if not relieved by Colace only.  ° °MiraLax (polyethylene glycol) - Pick up over-the-counter to have on hand.  MiraLax is a solution that will increase the amount of water in your bowels to assist with bowel movements.  Take as directed and can mix with a glass of water, juice, soda, coffee, or tea.  Take if you go more than two days without a movement. °Do not use MiraLax more than once per day. Call your doctor if you are still constipated or irregular after using this medication for 7 days in a row. ° °If you continue to have problems with postoperative constipation, please contact the office for further assistance and recommendations.  If you experience "the worst abdominal pain ever" or develop nausea or vomiting, please contact the office immediatly for further recommendations for treatment. ° °ITCHING ° If you experience itching with your medications, try taking only a single pain pill, or even half a pain pill at a time.  You can also use Benadryl over the counter for itching or also to help with sleep.  ° °TED HOSE STOCKINGS °Wear the elastic stockings on both legs for three weeks following surgery during the day but you may remove then at night for sleeping. ° °MEDICATIONS °See your medication summary on the “After Visit Summary” that the nursing staff will review with you prior to discharge.  You may have some home medications which will be placed on hold until you complete the course of blood thinner medication.  It is important for you to complete the blood thinner medication as prescribed by your surgeon.  Continue your approved medications as instructed at time of discharge. ° °PRECAUTIONS °If you experience chest pain or shortness of breath - call 911 immediately for transfer to the hospital emergency department.  °If you develop a fever greater that 101 F, purulent drainage from wound, increased redness or  drainage from wound, foul odor from the wound/dressing, or calf pain - CONTACT YOUR SURGEON.   °                                                °FOLLOW-UP APPOINTMENTS °Make sure you keep all of your appointments after your operation with your surgeon and caregivers. You should call the office at the above phone number and make an appointment for approximately two weeks after the date of your surgery or on the date instructed by your surgeon outlined in the "After Visit Summary". ° °RANGE OF MOTION AND STRENGTHENING EXERCISES  °These exercises are designed to help you keep full movement of your hip joint. Follow your caregiver's or physical therapist's instructions. Perform all exercises about fifteen times, three times per day or as directed. Exercise both hips, even if you have had only one joint replacement. These exercises can be done on   a training (exercise) mat, on the floor, on a table or on a bed. Use whatever works the best and is most comfortable for you. Use music or television while you are exercising so that the exercises are a pleasant break in your day. This will make your life better with the exercises acting as a break in routine you can look forward to.  °• Lying on your back, slowly slide your foot toward your buttocks, raising your knee up off the floor. Then slowly slide your foot back down until your leg is straight again.  °• Lying on your back spread your legs as far apart as you can without causing discomfort.  °• Lying on your side, raise your upper leg and foot straight up from the floor as far as is comfortable. Slowly lower the leg and repeat.  °• Lying on your back, tighten up the muscle in the front of your thigh (quadriceps muscles). You can do this by keeping your leg straight and trying to raise your heel off the floor. This helps strengthen the largest muscle supporting your knee.  °• Lying on your back, tighten up the muscles of your buttocks both with the legs straight and with  the knee bent at a comfortable angle while keeping your heel on the floor.  ° °IF YOU ARE TRANSFERRED TO A SKILLED REHAB FACILITY °If the patient is transferred to a skilled rehab facility following release from the hospital, a list of the current medications will be sent to the facility for the patient to continue.  When discharged from the skilled rehab facility, please have the facility set up the patient's Home Health Physical Therapy prior to being released. Also, the skilled facility will be responsible for providing the patient with their medications at time of release from the facility to include their pain medication, the muscle relaxants, and their blood thinner medication. If the patient is still at the rehab facility at time of the two week follow up appointment, the skilled rehab facility will also need to assist the patient in arranging follow up appointment in our office and any transportation needs. ° °MAKE SURE YOU:  °• Understand these instructions.  °• Get help right away if you are not doing well or get worse.  ° ° °Pick up stool softner and laxative for home use following surgery while on pain medications. °Do not submerge incision under water. °Please use good hand washing techniques while changing dressing each day. °May shower starting three days after surgery. °Please use a clean towel to pat the incision dry following showers. °Continue to use ice for pain and swelling after surgery. °Do not use any lotions or creams on the incision until instructed by your surgeon. ° °

## 2018-09-11 NOTE — Op Note (Signed)
OPERATIVE REPORT- TOTAL HIP ARTHROPLASTY   PREOPERATIVE DIAGNOSIS: Osteoarthritis of the Right hip.   POSTOPERATIVE DIAGNOSIS: Osteoarthritis of the Right  hip.   PROCEDURE: Right total hip arthroplasty, anterior approach.   SURGEON: Gaynelle Arabian, MD   ASSISTANT: Griffith Citron, PA-C  ANESTHESIA:  Spinal  ESTIMATED BLOOD LOSS:-375 mL    DRAINS: Hemovac x1.   COMPLICATIONS: None   CONDITION: PACU - hemodynamically stable.   BRIEF CLINICAL NOTE: Lisa Ward is a 73 y.o. female who has advanced end-  stage arthritis of their Right  hip with progressively worsening pain and  dysfunction.The patient has failed nonoperative management and presents for  total hip arthroplasty.   PROCEDURE IN DETAIL: After successful administration of spinal  anesthetic, the traction boots for the [facility] bed were placed on both  feet and the patient was placed onto the [facility] bed, boots placed into the leg  holders. The Right hip was then isolated from the perineum with plastic  drapes and prepped and draped in the usual sterile fashion. ASIS and  greater trochanter were marked and a oblique incision was made, starting  at about 1 cm lateral and 2 cm distal to the ASIS and coursing towards  the anterior cortex of the femur. The skin was cut with a 10 blade  through subcutaneous tissue to the level of the fascia overlying the  tensor fascia lata muscle. The fascia was then incised in line with the  incision at the junction of the anterior third and posterior 2/3rd. The  muscle was teased off the fascia and then the interval between the TFL  and the rectus was developed. The Hohmann retractor was then placed at  the top of the femoral neck over the capsule. The vessels overlying the  capsule were cauterized and the fat on top of the capsule was removed.  A Hohmann retractor was then placed anterior underneath the rectus  femoris to give exposure to the entire anterior capsule. A  T-shaped  capsulotomy was performed. The edges were tagged and the femoral head  was identified.       Osteophytes are removed off the superior acetabulum.  The femoral neck was then cut in situ with an oscillating saw. Traction  was then applied to the left lower extremity utilizing the [facility]  traction. The femoral head was then removed. Retractors were placed  around the acetabulum and then circumferential removal of the labrum was  performed. Osteophytes were also removed. Reaming starts at 49 mm to  medialize and  Increased in 2 mm increments to 51 mm. We reamed in  approximately 40 degrees of abduction, 20 degrees anteversion. A 52 mm  pinnacle acetabular shell was then impacted in anatomic position under  fluoroscopic guidance with excellent purchase. We did not need to place  any additional dome screws. A 32 mm neutral + 4 marathon liner was then  placed into the acetabular shell.       The femoral lift was then placed along the lateral aspect of the femur  just distal to the vastus ridge. The leg was  externally rotated and capsule  was stripped off the inferior aspect of the femoral neck down to the  level of the lesser trochanter, this was done with electrocautery. The femur was lifted after this was performed. The  leg was then placed in an extended and adducted position essentially delivering the femur. We also removed the capsule superiorly and the piriformis from the piriformis  fossa to gain excellent exposure of the  proximal femur. Rongeur was used to remove some cancellous bone to get  into the lateral portion of the proximal femur for placement of the  initial starter reamer. The starter broaches was placed  the starter broach  and was shown to go down the center of the canal. Broaching  with the Actis system was then performed starting at size 0  coursing  Up to size 10. A size 10 had excellent torsional and rotational  and axial stability. The trial high offset neck was  then placed  with a 32 + 1 trial head. The hip was then reduced. We confirmed that  the stem was in the canal both on AP and lateral x-rays. It also has excellent sizing. The hip was reduced with outstanding stability through full extension and full external rotation.. AP pelvis was taken and the leg lengths were measured and found to be equal. Hip was then dislocated again and the femoral head and neck removed. The  femoral broach was removed. Size 10 Actis stem with a high offset  neck was then impacted into the femur following native anteversion. Has  excellent purchase in the canal. Excellent torsional and rotational and  axial stability. It is confirmed to be in the canal on AP and lateral  fluoroscopic views. The 32 + 1 metal head was placed and the hip  reduced with outstanding stability. Again AP pelvis was taken and it  confirmed that the leg lengths were equal. The wound was then copiously  irrigated with saline solution and the capsule reattached and repaired  with Ethibond suture. 30 ml of .25% Bupivicaine was  injected into the capsule and into the edge of the tensor fascia lata as well as subcutaneous tissue. The fascia overlying the tensor fascia lata was then closed with a running #1 V-Loc. Subcu was closed with interrupted 2-0 Vicryl and subcuticular running 4-0 Monocryl. Incision was cleaned  and dried. Steri-Strips and a bulky sterile dressing applied. Hemovac  drain was hooked to suction and then the patient was awakened and transported to  recovery in stable condition.        Please note that a surgical assistant was a medical necessity for this procedure to perform it in a safe and expeditious manner. Assistant was necessary to provide appropriate retraction of vital neurovascular structures and to prevent femoral fracture and allow for anatomic placement of the prosthesis.  Lisa Ward, M.D.

## 2018-09-11 NOTE — Plan of Care (Signed)
  Problem: Acute Rehab PT Goals(only PT should resolve) Goal: Patient Will Transfer Sit To/From Stand Outcome: Progressing Flowsheets (Taken 09/11/2018 1836) Patient will transfer sit to/from stand: with supervision Goal: Pt Will Transfer Bed To Chair/Chair To Bed Outcome: Progressing Flowsheets (Taken 09/11/2018 1836) Pt will Transfer Bed to Chair/Chair to Bed: with supervision Goal: Pt Will Ambulate Outcome: Progressing Flowsheets (Taken 09/11/2018 1836) Pt will Ambulate:  > 125 feet  with supervision  with least restrictive assistive device Goal: Pt/caregiver will Perform Home Exercise Program Outcome: Progressing Flowsheets (Taken 09/11/2018 1836) Pt/caregiver will Perform Home Exercise Program:  For increased ROM  For increased strengthening  With Supervision, verbal cues required/provided

## 2018-09-11 NOTE — Anesthesia Postprocedure Evaluation (Signed)
Anesthesia Post Note  Patient: SHANEQUIA KENDRICK  Procedure(s) Performed: TOTAL HIP ARTHROPLASTY ANTERIOR APPROACH (Right Hip)     Patient location during evaluation: PACU Anesthesia Type: Spinal Level of consciousness: oriented and awake and alert Pain management: pain level controlled Vital Signs Assessment: post-procedure vital signs reviewed and stable Respiratory status: spontaneous breathing, respiratory function stable and patient connected to nasal cannula oxygen Cardiovascular status: blood pressure returned to baseline and stable Postop Assessment: no headache, no backache and no apparent nausea or vomiting Anesthetic complications: no    Last Vitals:  Vitals:   09/11/18 1530 09/11/18 1558  BP: 111/60 109/60  Pulse: 76 74  Resp: 16 16  Temp: 36.7 C (!) 36.4 C  SpO2: 95% 100%    Last Pain:  Vitals:   09/11/18 1558  TempSrc: Oral  PainSc:                  Curtiss Mahmood

## 2018-09-11 NOTE — Anesthesia Procedure Notes (Addendum)
Spinal  Patient location during procedure: OR Start time: 09/11/2018 12:18 PM End time: 09/11/2018 12:27 PM Staffing Anesthesiologist: Janeece Riggers, MD Resident/CRNA: Silas Sacramento, CRNA Performed: resident/CRNA  Preanesthetic Checklist Completed: patient identified, site marked, surgical consent, pre-op evaluation, timeout performed, IV checked, risks and benefits discussed and monitors and equipment checked Spinal Block Patient position: sitting Prep: DuraPrep Patient monitoring: heart rate, cardiac monitor, continuous pulse ox and blood pressure Approach: midline Location: L3-4 Injection technique: single-shot Needle Needle type: Sprotte  Needle gauge: 24 G Needle length: 9 cm Assessment Sensory level: T4 Additional Notes IV functioning, monitors applied to pt. Expiration date of kit checked and confirmed to be in date. Sterile prep and drape, hand hygiene and sterile gloved used. Pt was positioned and spine was prepped in sterile fashion. Skin was anesthetized with lidocaine. Free flow of clear CSF obtained prior to injecting local anesthetic into CSF x 1 attempt. Spinal needle aspirated freely following injection. Needle was carefully withdrawn, and pt tolerated procedure well. Loss of motor and sensory on exam post injection.

## 2018-09-11 NOTE — Anesthesia Preprocedure Evaluation (Addendum)
Anesthesia Evaluation  Patient identified by MRN, date of birth, ID band Patient awake    Reviewed: Allergy & Precautions, NPO status , Patient's Chart, lab work & pertinent test results  History of Anesthesia Complications (+) PONV and history of anesthetic complications  Airway Mallampati: II  TM Distance: >3 FB Neck ROM: Full    Dental no notable dental hx.    Pulmonary neg pulmonary ROS,    Pulmonary exam normal breath sounds clear to auscultation       Cardiovascular hypertension, Pt. on medications negative cardio ROS Normal cardiovascular exam Rhythm:Regular Rate:Normal  EKG 8/20 Sinus rhythm with 1st degree A-V block Left axis deviation Inferior infarct , age undetermined Possible Anterior infarct , age undetermined versus lead placement   Neuro/Psych negative neurological ROS  negative psych ROS   GI/Hepatic Neg liver ROS, GERD  Medicated,  Endo/Other  negative endocrine ROS  Renal/GU negative Renal ROS  negative genitourinary   Musculoskeletal  (+) Arthritis , Osteoarthritis,    Abdominal   Peds negative pediatric ROS (+)  Hematology negative hematology ROS (+)   Anesthesia Other Findings   Reproductive/Obstetrics negative OB ROS                             Anesthesia Physical Anesthesia Plan  ASA: II  Anesthesia Plan: Spinal   Post-op Pain Management:    Induction:   PONV Risk Score and Plan: 3 and Treatment may vary due to age or medical condition  Airway Management Planned: Nasal Cannula, Simple Face Mask and Natural Airway  Additional Equipment:   Intra-op Plan:   Post-operative Plan:   Informed Consent:   Plan Discussed with: Anesthesiologist, CRNA and Surgeon  Anesthesia Plan Comments: (  )       Anesthesia Quick Evaluation

## 2018-09-11 NOTE — Evaluation (Signed)
Physical Therapy Evaluation Patient Details Name: Lisa Ward MRN: [id number] DOB: [birth year] Today's Date: 09/11/2018   History of Present Illness  Lisa Ward, 73 y.o. female, has a history of pain and functional disability in the right hip(s) due to arthritis and patient has failed non-surgical conservative treatments for greater than 12 weeks to include corticosteriod injections, use of assistive devices and activity modification. Pt no s/p Rt THA anterior approach.    Clinical Impression  Lisa Ward is a 73 y.o. female POD 0 s/p Rt anterior THA. She is agreeable to participate in therapy and denies pain throughout session. Patient is limited by functional impairments listed below (see PT Problem List). She required min assist for bed mobility to manage Rt LE movement and min assist for transfers and gait this session. Patient was educated throughout gait on safe hand placement and proximity with RW as she has not used one previously. She has a good support system with her daughter in law and family and anticipate she will be safe to go home following additional therapy treatment and when medically ready. She will benefit from additional skilled Acute PT to progress independence with mobility in preparation for safe discharge home.    Follow Up Recommendations Follow surgeon's recommendation for DC plan and follow-up therapies(pt would benefit from HHPT as she reports balance difficulty leading up to surgery)    Equipment Recommendations  Rolling walker with 5" wheels    Recommendations for Other Services       Precautions / Restrictions Precautions Precautions: Fall Precaution Comments: Rt THA Ant approach Restrictions Weight Bearing Restrictions: No      Mobility  Bed Mobility Overal bed mobility: Needs Assistance Bed Mobility: Supine to Sit     Supine to sit: HOB elevated;Min assist     General bed mobility comments: HOB elevated, patient slow  and required assist for Rt LE mobility  Transfers Overall transfer level: Needs assistance Equipment used: Rolling walker (2 wheeled) Transfers: Sit to/from Stand Sit to Stand: Min assist         General transfer comment: cues for safe hand placement on RW and min assist to initiate power up  Ambulation/Gait Ambulation/Gait assistance: Min assist Gait Distance (Feet): 100 Feet Assistive device: Rolling walker (2 wheeled) Gait Pattern/deviations: Step-through pattern;Decreased step length - right;Decreased stride length;Trunk flexed Gait velocity: slight decrease   General Gait Details: pt with good steadiness using RW, decreased Rt hip flexion and knee flexion resulting in reduced step length  Stairs            Wheelchair Mobility    Modified Rankin (Stroke Patients Only)       Balance Overall balance assessment: Needs assistance Sitting-balance support: No upper extremity supported;Feet supported Sitting balance-Leahy Scale: Good       Standing balance-Leahy Scale: Fair Standing balance comment: pt able to maintain static standing but requires RW for UE support with dynamic standing and gait                             Pertinent Vitals/Pain Pain Assessment: No/denies pain    Home Living Family/patient expects to be discharged to:: Private residence Living Arrangements: Alone(pt's daughter-in-law is staying with her for 2 weeks) Available Help at Discharge: Family Type of Home: House Home Access: Ramped entrance     Home Layout: One level Home Equipment: Environmental consultantWalker - 4 wheels;Cane - single point;Bedside commode;Shower seat - built in;Shower seat;Grab bars -  tub/shower Additional Comments: pt has walk in shower with shower seat, pt has ramped entrance in the back of her house and does not use the front steps. she has a rollator that is ~ 26 years old and does not have any other walkers.    Prior Function Level of Independence: Independent with  assistive device(s)         Comments: pt was ambulating with rollator prior to surgery and ~ 5 weeks prior to that had been using a SPC for mobiltiy     Hand Dominance   Dominant Hand: Right    Extremity/Trunk Assessment   Upper Extremity Assessment Upper Extremity Assessment: Overall WFL for tasks assessed    Lower Extremity Assessment Lower Extremity Assessment: RLE deficits/detail RLE Deficits / Details: 3+/5 throughout, pt able limited greatest with hip flexion    Cervical / Trunk Assessment Cervical / Trunk Assessment: Kyphotic  Communication   Communication: No difficulties  Cognition Arousal/Alertness: Awake/alert Behavior During Therapy: WFL for tasks assessed/performed Overall Cognitive Status: Within Functional Limits for tasks assessed      General Comments      Exercises Total Joint Exercises Ankle Circles/Pumps: AROM;5 reps;Both;Supine Heel Slides: AROM;5 reps;Right;Supine Hip ABduction/ADduction: 5 reps;AROM;Right;Supine   Assessment/Plan    PT Assessment Patient needs continued PT services  PT Problem List Decreased strength;Decreased balance;Decreased mobility;Decreased activity tolerance;Decreased knowledge of use of DME       PT Treatment Interventions DME instruction;Functional mobility training;Balance training;Patient/family education;Modalities;Gait training;Therapeutic activities;Stair training;Therapeutic exercise;Manual techniques    PT Goals (Current goals can be found in the Care Plan section)  Acute Rehab PT Goals Patient Stated Goal: pt wants to go home tomorrow PT Goal Formulation: With patient/family Time For Goal Achievement: 09/14/18 Potential to Achieve Goals: Good    Frequency 7X/week    AM-PAC PT "6 Clicks" Mobility  Outcome Measure Help needed turning from your back to your side while in a flat bed without using bedrails?: A Little Help needed moving from lying on your back to sitting on the side of a flat bed  without using bedrails?: A Little Help needed moving to and from a bed to a chair (including a wheelchair)?: A Little Help needed standing up from a chair using your arms (e.g., wheelchair or bedside chair)?: A Little Help needed to walk in hospital room?: A Little Help needed climbing 3-5 steps with a railing? : A Little 6 Click Score: 18    End of Session Equipment Utilized During Treatment: Gait belt Activity Tolerance: Patient tolerated treatment well Patient left: in bed;with call bell/phone within reach;with family/visitor present;with nursing/sitter in room Nurse Communication: Mobility status PT Visit Diagnosis: Unsteadiness on feet (R26.81);Difficulty in walking, not elsewhere classified (R26.2);Muscle weakness (generalized) (M62.81)    Time: 3532-9924 PT Time Calculation (min) (ACUTE ONLY): 33 min   Charges:   PT Evaluation $PT Eval Low Complexity: 1 Low PT Treatments $Gait Training: 8-22 mins        Kipp Brood, PT, DPT, [facility] Physical Therapist with [facility]  09/11/2018 6:36 PM

## 2018-09-12 ENCOUNTER — Encounter (HOSPITAL_COMMUNITY): Payer: Self-pay | Admitting: Orthopedic Surgery

## 2018-09-12 LAB — CBC
HCT: 33.4 % — ABNORMAL LOW (ref 36.0–46.0)
Hemoglobin: 11 g/dL — ABNORMAL LOW (ref 12.0–15.0)
MCH: 30.4 pg (ref 26.0–34.0)
MCHC: 32.9 g/dL (ref 30.0–36.0)
MCV: 92.3 fL (ref 80.0–100.0)
Platelets: 244 10*3/uL (ref 150–400)
RBC: 3.62 MIL/uL — ABNORMAL LOW (ref 3.87–5.11)
RDW: 13.9 % (ref 11.5–15.5)
WBC: 14.1 10*3/uL — ABNORMAL HIGH (ref 4.0–10.5)
nRBC: 0 % (ref 0.0–0.2)

## 2018-09-12 LAB — BASIC METABOLIC PANEL
Anion gap: 8 (ref 5–15)
BUN: 12 mg/dL (ref 8–23)
CO2: 28 mmol/L (ref 22–32)
Calcium: 9 mg/dL (ref 8.9–10.3)
Chloride: 104 mmol/L (ref 98–111)
Creatinine, Ser: 0.54 mg/dL (ref 0.44–1.00)
GFR calc Af Amer: >60 mL/min (ref >60)
GFR calc non Af Amer: >60 mL/min (ref >60)
Glucose, Bld: 138 mg/dL — ABNORMAL HIGH (ref 70–99)
Potassium: 3.1 mmol/L — ABNORMAL LOW (ref 3.5–5.1)
Sodium: 140 mmol/L (ref 135–145)

## 2018-09-12 MED ORDER — TRAMADOL HCL 50 MG PO TABS: 50.0000 mg | ORAL_TABLET | Freq: Four times a day (QID) | ORAL | 0 refills | Status: DC | PRN

## 2018-09-12 MED ORDER — HYDROCODONE-ACETAMINOPHEN 5-325 MG PO TABS: 1.0000 | ORAL_TABLET | Freq: Four times a day (QID) | ORAL | 0 refills | Status: DC | PRN

## 2018-09-12 MED ORDER — METHOCARBAMOL 500 MG PO TABS: 500.0000 mg | ORAL_TABLET | Freq: Four times a day (QID) | ORAL | 0 refills | Status: DC | PRN

## 2018-09-12 MED ORDER — ASPIRIN 325 MG PO TBEC: 325.0000 mg | DELAYED_RELEASE_TABLET | Freq: Two times a day (BID) | ORAL | 0 refills | Status: AC

## 2018-09-12 MED ORDER — POTASSIUM CHLORIDE CRYS ER 20 MEQ PO TBCR
40.0000 meq | EXTENDED_RELEASE_TABLET | Freq: Once | ORAL | Status: AC
  Administered 2018-09-12: 40 meq via ORAL
  Filled 2018-09-12: qty 2

## 2018-09-12 NOTE — TOC Transition Note (Signed)
Transition of Care [facility]) - CM/SW Discharge Note   Patient Details  Name: Lisa Ward MRN: [id number] Date of Birth: [birth year]  Transition of Care [facility]) CM/SW Contact:  Leeroy Cha, RN Phone Number: 09/12/2018, 10:52 AM   Clinical Narrative:    dcd to home with HEP   Final next level of care: Home/Self Care Barriers to Discharge: No Barriers Identified   Patient Goals and CMS Choice Patient states their goals for this hospitalization and ongoing recovery are:: to go home and do the excerise program CMS Medicare.gov Compare Post Acute Care list provided to:: Patient    Discharge Placement                       Discharge Plan and Services                                     Social Determinants of Health (SDOH) Interventions     Readmission Risk Interventions No flowsheet data found.

## 2018-09-12 NOTE — Progress Notes (Signed)
Physical Therapy Treatment Patient Details Name: Lisa Ward Savas MRN: [id number] DOB: December 12, 1945 Today's Date: 09/12/2018    History of Present Illness Lisa Ward [place], 73 y.o. female, has a history of pain and functional disability in the right hip(s) due to arthritis and patient has failed non-surgical conservative treatments for greater than 12 weeks to include corticosteriod injections, use of assistive devices and activity modification. Pt no s/p Rt THA anterior approach.    PT Comments    Patient making good progress with mobility and able to complete bed mobility and sit to stand without physical assistance this session. Patient improved safety with RW during gait demonstrating safe hand placement and good proximity to RW. Patient educated on HEP and provided handout, she completed standing exercise at counter for support and supine exercises with good form. Patient is safe to discharge home at this time based on current function with mobility. Anticipate discharge when medically ready, acute PT will follow throughout duration of stay.   Follow Up Recommendations  Follow surgeon's recommendation for DC plan and follow-up therapies     Equipment Recommendations  Rolling walker with 5" wheels    Recommendations for Other Services       Precautions / Restrictions Precautions Precautions: Fall Precaution Comments: Rt THA Ant approach Restrictions Weight Bearing Restrictions: No    Mobility  Bed Mobility Overal bed mobility: Needs Assistance Bed Mobility: Supine to Sit     Supine to sit: HOB elevated;Supervision     General bed mobility comments: HOB elevated, patient slow, cues for scooting Rt LE to EOB wtih help of Lt LE  Transfers Overall transfer level: Needs assistance Equipment used: Rolling walker (2 wheeled) Transfers: Sit to/from Stand Sit to Stand: Supervision         General transfer comment: pt with good carryover for hand placement from  yesterday, pt with effortful power up but able to complete transfer without assistance  Ambulation/Gait Ambulation/Gait assistance: Supervision Gait Distance (Feet): 300 Feet Assistive device: Rolling walker (2 wheeled) Gait Pattern/deviations: Step-through pattern;Decreased step length - right;Decreased stride length;Trunk flexed Gait velocity: slight decrease   General Gait Details: pt required cues initially for safe proximety to RW and hand placement on RW but able to maintain safe position throughout gait   Stairs             Wheelchair Mobility    Modified Rankin (Stroke Patients Only)       Balance Overall balance assessment: Needs assistance Sitting-balance support: No upper extremity supported;Feet supported Sitting balance-Leahy Scale: Good       Standing balance-Leahy Scale: Fair Standing balance comment: pt able to maintain static standing but requires RW for UE support with dynamic standing and gait                            Cognition Arousal/Alertness: Awake/alert Behavior During Therapy: WFL for tasks assessed/performed Overall Cognitive Status: Within Functional Limits for tasks assessed                 Exercises Total Joint Exercises Quad Sets: AROM;5 reps;Supine;Right Heel Slides: AROM;5 reps;Right;Supine Hip ABduction/ADduction: 5 reps;AROM;Right;Standing;Left Long Arc Quad: AROM;5 reps;Seated;Right Knee Flexion: AROM;5 reps;Both;Standing Marching in Standing: 5 reps;AROM;Both;Standing Standing Hip Extension: AROM;Both;5 reps;Standing    General Comments        Pertinent Vitals/Pain Pain Assessment: 0-10 Pain Score: 3  Pain Descriptors / Indicators: Sore Pain Intervention(s): Limited activity within patient's tolerance;Monitored during session;Premedicated before session  PT Goals (current goals can now be found in the care plan section) Acute Rehab PT Goals Patient Stated Goal: pt wants to go home  tomorrow PT Goal Formulation: With patient/family Time For Goal Achievement: 09/14/18 Potential to Achieve Goals: Good Progress towards PT goals: Progressing toward goals    Frequency    7X/week      PT Plan Current plan remains appropriate       AM-PAC PT "6 Clicks" Mobility   Outcome Measure  Help needed turning from your back to your side while in a flat bed without using bedrails?: A Little Help needed moving from lying on your back to sitting on the side of a flat bed without using bedrails?: A Little Help needed moving to and from a bed to a chair (including a wheelchair)?: A Little Help needed standing up from a chair using your arms (e.g., wheelchair or bedside chair)?: A Little Help needed to walk in hospital room?: A Little Help needed climbing 3-5 steps with a railing? : A Little 6 Click Score: 18    End of Session Equipment Utilized During Treatment: Gait belt Activity Tolerance: Patient tolerated treatment well Patient left: in bed;with call bell/phone within reach Nurse Communication: Mobility status PT Visit Diagnosis: Unsteadiness on feet (R26.81);Difficulty in walking, not elsewhere classified (R26.2);Muscle weakness (generalized) (M62.81)     Time: 2993-7169 PT Time Calculation (min) (ACUTE ONLY): 30 min  Charges:  $Gait Training: 8-22 mins $Therapeutic Exercise: 8-22 mins                     Kipp Brood, PT, DPT, [facility] Physical Therapist with [facility]  09/12/2018 9:26 AM

## 2018-09-12 NOTE — Progress Notes (Addendum)
   Subjective: 1 Day Post-Op Procedure(s) (LRB): TOTAL HIP ARTHROPLASTY ANTERIOR APPROACH (Right) Patient reports pain as mild.   Patient seen in rounds by Dr. Wynelle Link. Patient is well, and has had no acute complaints or problems other than discomfort in the right hip. No acute events overnight. Foley catheter removed, positive flatus. Denies CP, SHOB. Patient states she is ready to go home today. We will continue therapy today.   Objective: Vital signs in last 24 hours: Temp:  [97.5 F (36.4 C)-98.4 F (36.9 C)] 98.4 F (36.9 C) (08/06 0552) Pulse Rate:  [73-92] 73 (08/06 0552) Resp:  [12-19] 17 (08/06 0552) BP: (95-119)/(57-82) 118/60 (08/06 0552) SpO2:  [95 %-100 %] 99 % (08/06 0552)  Intake/Output from previous day:  Intake/Output Summary (Last 24 hours) at 09/12/2018 0720 Last data filed at 09/12/2018 0617 Gross per 24 hour  Intake 3448.47 ml  Output 3240 ml  Net 208.47 ml     Intake/Output this shift: No intake/output data recorded.  Labs: Recent Labs    09/10/18 1414 09/12/18 0243  HGB 14.0 11.0*   Recent Labs    09/10/18 1414 09/12/18 0243  WBC 11.0* 14.1*  RBC 4.69 3.62*  HCT 42.8 33.4*  PLT 316 244   Recent Labs    09/10/18 1414 09/12/18 0243  NA 138 140  K 3.3* 3.1*  CL 100 104  CO2 27 28  BUN 17 12  CREATININE 0.58 0.54  GLUCOSE 108* 138*  CALCIUM 10.2 9.0   Recent Labs    09/10/18 1414  INR 0.9    Exam: General - Patient is Alert and Oriented Extremity - Neurologically intact Sensation intact distally Intact pulses distally Dorsiflexion/Plantar flexion intact Dressing - dressing C/D/I Motor Function - intact, moving foot and toes well on exam.   Past Medical History:  Diagnosis Date  . Esophageal reflux   . HLD (hyperlipidemia)   . HTN (hypertension)   . Osteoporosis   . PONV (postoperative nausea and vomiting)   . Situational stress     Assessment/Plan: 1 Day Post-Op Procedure(s) (LRB): TOTAL HIP ARTHROPLASTY ANTERIOR  APPROACH (Right) Principal Problem:   OA (osteoarthritis) of hip  Estimated body mass index is 30.74 kg/m as calculated from the following:   Height as of 09/10/18: 5\' 4"  (1.626 m).   Weight as of 09/10/18: 81.2 kg. Advance diet Up with therapy D/C IV fluids  DVT Prophylaxis - Aspirin Weight bearing as tolerated. D/C O2 and pulse ox and try on room air. Hemovac pulled without difficulty, will begin therapy.  Potassium of 3.1 today, one dose of 40 mEq potassium chloride ordered. On potassium supplements at home. Plan is to go Home after hospital stay with HEP. Plan for discharge today after 1-2 sessions of physical therapy as long as she is meeting goals. Follow up in the office with Dr. Wynelle Link in 2 weeks.   Griffith Citron, PA-C Orthopedic Surgery 09/12/2018, 7:20 AM

## 2018-09-15 NOTE — Discharge Summary (Signed)
Physician Discharge Summary   Patient ID: Lisa Ward MRN: [id number] DOB/AGE: 1946/01/06 73 y.o.  Admit date: 09/11/2018 Discharge date: 09/12/2018  Primary Diagnosis: Osteoarthritis of the Right  hip  Admission Diagnoses:  Past Medical History:  Diagnosis Date   Esophageal reflux    HLD (hyperlipidemia)    HTN (hypertension)    Osteoporosis    PONV (postoperative nausea and vomiting)    Situational stress    Discharge Diagnoses:   Principal Problem:   OA (osteoarthritis) of hip  Estimated body mass index is 30.74 kg/m as calculated from the following:   Height as of this encounter: 5\' 4"  (1.626 m).   Weight as of this encounter: 81.2 kg.  Procedure:  Procedure(s) (LRB): TOTAL HIP ARTHROPLASTY ANTERIOR APPROACH (Right)   Consults: None  HPI: Lisa Ward is a 73 y.o. female who has advanced end- stage arthritis of their Right  hip with progressively worsening pain and dysfunction.The patient has failed nonoperative management and presents for  total hip arthroplasty.    Laboratory Data: Admission on 09/11/2018, Discharged on 09/12/2018  Component Date Value Ref Range Status   WBC 09/12/2018 14.1* 4.0 - 10.5 K/uL Final   RBC 09/12/2018 3.62* 3.87 - 5.11 MIL/uL Final   Hemoglobin 09/12/2018 11.0* 12.0 - 15.0 g/dL Final   HCT 40/98/1191 33.4* 36.0 - 46.0 % Final   MCV 09/12/2018 92.3  80.0 - 100.0 fL Final   MCH 09/12/2018 30.4  26.0 - 34.0 pg Final   MCHC 09/12/2018 32.9  30.0 - 36.0 g/dL Final   RDW 47/82/9562 13.9  11.5 - 15.5 % Final   Platelets 09/12/2018 244  150 - 400 K/uL Final   nRBC 09/12/2018 0.0  0.0 - 0.2 % Final   Performed at [facility], [address], [place], [place] [address]   Sodium 09/12/2018 140  135 - 145 mmol/L Final   Potassium 09/12/2018 3.1* 3.5 - 5.1 mmol/L Final   Chloride 09/12/2018 104  98 - 111 mmol/L Final   CO2 09/12/2018 28  22 - 32 mmol/L Final   Glucose, Bld 09/12/2018  138* 70 - 99 mg/dL Final   BUN 57/84/6962 12  8 - 23 mg/dL Final   Creatinine, Ser 09/12/2018 0.54  0.44 - 1.00 mg/dL Final   Calcium 95/28/4132 9.0  8.9 - 10.3 mg/dL Final   GFR calc non Af Amer 09/12/2018 >60  >60 mL/min Final   GFR calc Af Amer 09/12/2018 >60  >60 mL/min Final   Anion gap 09/12/2018 8  5 - 15 Final   Performed at [facility], [address], [place], [place] [address]  Hospital Outpatient Visit on 09/10/2018  Component Date Value Ref Range Status   aPTT 09/10/2018 30  24 - 36 seconds Final   Performed at [facility], [address], [place], [place] [address]   WBC 09/10/2018 11.0* 4.0 - 10.5 K/uL Final   RBC 09/10/2018 4.69  3.87 - 5.11 MIL/uL Final   Hemoglobin 09/10/2018 14.0  12.0 - 15.0 g/dL Final   HCT 66/44/0347 42.8  36.0 - 46.0 % Final   MCV 09/10/2018 91.3  80.0 - 100.0 fL Final   MCH 09/10/2018 29.9  26.0 - 34.0 pg Final   MCHC 09/10/2018 32.7  30.0 - 36.0 g/dL Final   RDW 42/59/5638 14.2  11.5 - 15.5 % Final   Platelets 09/10/2018 316  150 - 400 K/uL Final   nRBC 09/10/2018 0.0  0.0 - 0.2 %  Final   Performed at [facility], [address], [place], [place] [address]   Sodium 09/10/2018 138  135 - 145 mmol/L Final   Potassium 09/10/2018 3.3* 3.5 - 5.1 mmol/L Final   Chloride 09/10/2018 100  98 - 111 mmol/L Final   CO2 09/10/2018 27  22 - 32 mmol/L Final   Glucose, Bld 09/10/2018 108* 70 - 99 mg/dL Final   BUN 19/14/7829 17  8 - 23 mg/dL Final   Creatinine, Ser 09/10/2018 0.58  0.44 - 1.00 mg/dL Final   Calcium 56/21/3086 10.2  8.9 - 10.3 mg/dL Final   Total Protein 57/84/6962 8.0  6.5 - 8.1 g/dL Final   Albumin 95/28/4132 4.2  3.5 - 5.0 g/dL Final   AST 44/02/270 12* 15 - 41 U/L Final   ALT 09/10/2018 11  0 - 44 U/L Final   Alkaline Phosphatase 09/10/2018 74  38 - 126 U/L Final   Total Bilirubin 09/10/2018 1.4* 0.3 - 1.2 mg/dL Final   GFR calc non Af Amer  09/10/2018 >60  >60 mL/min Final   GFR calc Af Amer 09/10/2018 >60  >60 mL/min Final   Anion gap 09/10/2018 11  5 - 15 Final   Performed at [facility], [address], [place], [place] [address]   Prothrombin Time 09/10/2018 12.4  11.4 - 15.2 seconds Final   INR 09/10/2018 0.9  0.8 - 1.2 Final   Comment: (NOTE) INR goal varies based on device and disease states. Performed at [facility], [address], [place], [place] [address]    ABO/RH(D) 09/10/2018 A POS   Final   Antibody Screen 09/10/2018 NEG   Final   Sample Expiration 09/10/2018 09/14/2018,2359   Final   Extend sample reason 09/10/2018    Final                   Value:NO TRANSFUSIONS OR PREGNANCY IN THE PAST 3 MONTHS Performed at [facility], [address] [place], [place] [address]    MRSA, PCR 09/10/2018 NEGATIVE  NEGATIVE Final   Staphylococcus aureus 09/10/2018 NEGATIVE  NEGATIVE Final   Comment: (NOTE) The Xpert SA Assay (FDA approved for NASAL specimens in patients 7 years of age and older), is one component of a comprehensive surveillance program. It is not intended to diagnose infection nor to guide or monitor treatment. Performed at [facility], [address] [place], [place] [address]   Hospital Outpatient Visit on 09/07/2018  Component Date Value Ref Range Status   SARS Coronavirus 2 09/07/2018 NEGATIVE  NEGATIVE Final   Comment: (NOTE) SARS-CoV-2 target nucleic acids are NOT DETECTED. The SARS-CoV-2 RNA is generally detectable in upper and lower respiratory specimens during the acute phase of infection. Negative results do not preclude SARS-CoV-2 infection, do not rule out co-infections with other pathogens, and should not be used as the sole basis for treatment or other patient management decisions. Negative results must be combined with clinical observations, patient history, and epidemiological information.  The expected result is Negative. Fact Sheet for Patients: HairSlick.no Fact Sheet for Healthcare Providers: quierodirigir.com This test is not yet approved or cleared by the [place] FDA and  has been authorized for detection and/or diagnosis of SARS-CoV-2 by FDA under an Emergency Use Authorization (EUA). This EUA will remain  in effect (meaning this test can be used) for the duration of the COVID-19 declaration under Section 56  4(b)(1) of the Act, 21 U.S.C. section 360bbb-3(b)(1), unless the authorization is terminated or revoked sooner. Performed at [facility], [address] [place], [place] [address]      X-Rays:Dg Pelvis Portable  Result Date: 09/11/2018 CLINICAL DATA:  Right anterior hip replacement EXAM: OPERATIVE right HIP (WITH PELVIS IF PERFORMED) 4 VIEWS TECHNIQUE: Fluoroscopic spot image(s) were submitted for interpretation post-operatively. COMPARISON:  None. FINDINGS: Right hip replacement in satisfactory position alignment. No immediate complication. IMPRESSION: Satisfactory right hip replacement. Electronically Signed   By: Marlan Palauharles  Clark M.D.   On: 09/11/2018 15:10   Dg C-arm 1-60 Min-no Report  Result Date: 09/11/2018 Fluoroscopy was utilized by the requesting physician.  No radiographic interpretation.   Dg Hip Operative Unilat W Or W/o Pelvis Right  Result Date: 09/11/2018 CLINICAL DATA:  Right anterior hip replacement EXAM: OPERATIVE right HIP (WITH PELVIS IF PERFORMED) 4 VIEWS TECHNIQUE: Fluoroscopic spot image(s) were submitted for interpretation post-operatively. COMPARISON:  None. FINDINGS: Right hip replacement in satisfactory position alignment. No immediate complication. IMPRESSION: Satisfactory right hip replacement. Electronically Signed   By: Marlan Palauharles  Clark M.D.   On: 09/11/2018 15:10    EKG: Orders placed or performed during the hospital encounter of 09/10/18    EKG 12-Lead   EKG 12-Lead     Hospital Course: Lisa Ward is a 73 y.o. who was [facility]. They were brought to the operating room on 09/11/2018 and underwent Procedure(s): TOTAL HIP ARTHROPLASTY ANTERIOR APPROACH.  Patient tolerated the procedure well and was later transferred to the recovery room and then to the orthopaedic floor for postoperative care. They were given PO and IV analgesics for pain control following their surgery. They were given 24 hours of postoperative antibiotics of  Anti-infectives (From admission, onward)   Start     Dose/Rate Route Frequency Ordered Stop   09/11/18 1830  ceFAZolin (ANCEF) IVPB 2g/100 mL premix     2 g 200 mL/hr over 30 Minutes Intravenous Every 6 hours 09/11/18 1607 09/12/18 0029   09/11/18 1115  ceFAZolin (ANCEF) IVPB 2g/100 mL premix     2 g 200 mL/hr over 30 Minutes Intravenous On call to O.R. 09/11/18 1108 09/11/18 1218     and started on DVT prophylaxis in the form of Aspirin.   PT and OT were ordered for total joint protocol. Discharge planning consulted to help with postop disposition and equipment needs.  Patient had a good night on the evening of surgery. They started to get up OOB with therapy on POD #0. Pt was seen during rounds and was ready to go home pending progress with therapy. She had a potassium of 3.1, and was given one dose of potassium chloride 40 mEq. Hemovac drain was pulled without difficulty. She worked with therapy on POD #1 and was meeting her goals. Pt was discharged to home later that day in stable condition.  Diet: Regular diet Activity: WBAT Follow-up: in 2 weeks Disposition: Home Discharged Condition: good   Discharge Instructions    Call MD / Call 911   Complete by: As directed    If you experience chest pain or shortness of breath, CALL 911 and be transported to the hospital emergency room.  If you develope a fever above 101 F, pus (white drainage) or increased drainage or redness  at the wound, or calf pain, call your surgeon's office.   Change dressing   Complete by: As directed    You may change your dressing on Thursday,  then change the dressing daily with sterile 4 x 4 inch gauze dressing and paper tape.   Constipation Prevention   Complete by: As directed    Drink plenty of fluids.  Prune juice may be helpful.  You may use a stool softener, such as Colace (over the counter) 100 mg twice a day.  Use MiraLax (over the counter) for constipation as needed.   Diet - low sodium heart healthy   Complete by: As directed    Discharge instructions   Complete by: As directed    Dr. Gaynelle Arabian Total Joint Specialist [facility] [address] [place], [place] [address] [phone]  ANTERIOR APPROACH TOTAL HIP REPLACEMENT POSTOPERATIVE DIRECTIONS   Hip Rehabilitation, Guidelines Following Surgery  The results of a hip operation are greatly improved after range of motion and muscle strengthening exercises. Follow all safety measures which are given to protect your hip. If any of these exercises cause increased pain or swelling in your joint, decrease the amount until you are comfortable again. Then slowly increase the exercises. Call your caregiver if you have problems or questions.   HOME CARE INSTRUCTIONS  Remove items at home which could result in a fall. This includes throw rugs or furniture in walking pathways.  ICE to the affected hip every three hours for 30 minutes at a time and then as needed for pain and swelling.  Continue to use ice on the hip for pain and swelling from surgery. You may notice swelling that will progress down to the foot and ankle.  This is normal after surgery.  Elevate the leg when you are not up walking on it.   Continue to use the breathing machine which will help keep your temperature down.  It is common for your temperature to cycle up and down following surgery, especially at night when you are not up moving around and  exerting yourself.  The breathing machine keeps your lungs expanded and your temperature down.  DIET You may resume your previous home diet once your are discharged from the hospital.  DRESSING / WOUND CARE / SHOWERING You may change your dressing 3-5 days after surgery.  Then change the dressing every day with sterile gauze.  Please use good hand washing techniques before changing the dressing.  Do not use any lotions or creams on the incision until instructed by your surgeon. You may start showering once you are discharged home but do not submerge the incision under water. Just pat the incision dry and apply a dry gauze dressing on daily. Change the surgical dressing daily and reapply a dry dressing each time.  ACTIVITY Walk with your walker as instructed. Use walker as long as suggested by your caregivers. Avoid periods of inactivity such as sitting longer than an hour when not asleep. This helps prevent blood clots.  You may resume a sexual relationship in one month or when given the OK by your doctor.  You may return to work once you are cleared by your doctor.  Do not drive a car for 6 weeks or until released by you surgeon.  Do not drive while taking narcotics.  WEIGHT BEARING Weight bearing as tolerated with assist device (walker, cane, etc) as directed, use it as long as suggested by your surgeon or therapist, typically at least 4-6 weeks.  POSTOPERATIVE CONSTIPATION PROTOCOL Constipation - defined medically as fewer than three stools per week and severe constipation as less than one stool per week.  One of the  most common issues patients have following surgery is constipation.  Even if you have a regular bowel pattern at home, your normal regimen is likely to be disrupted due to multiple reasons following surgery.  Combination of anesthesia, postoperative narcotics, change in appetite and fluid intake all can affect your bowels.  In order to avoid complications following surgery,  here are some recommendations in order to help you during your recovery period.  Colace (docusate) - Pick up an over-the-counter form of Colace or another stool softener and take twice a day as long as you are requiring postoperative pain medications.  Take with a full glass of water daily.  If you experience loose stools or diarrhea, hold the colace until you stool forms back up.  If your symptoms do not get better within 1 week or if they get worse, check with your doctor.  Dulcolax (bisacodyl) - Pick up over-the-counter and take as directed by the product packaging as needed to assist with the movement of your bowels.  Take with a full glass of water.  Use this product as needed if not relieved by Colace only.   MiraLax (polyethylene glycol) - Pick up over-the-counter to have on hand.  MiraLax is a solution that will increase the amount of water in your bowels to assist with bowel movements.  Take as directed and can mix with a glass of water, juice, soda, coffee, or tea.  Take if you go more than two days without a movement. Do not use MiraLax more than once per day. Call your doctor if you are still constipated or irregular after using this medication for 7 days in a row.  If you continue to have problems with postoperative constipation, please contact the office for further assistance and recommendations.  If you experience "the worst abdominal pain ever" or develop nausea or vomiting, please contact the office immediatly for further recommendations for treatment.  ITCHING  If you experience itching with your medications, try taking only a single pain pill, or even half a pain pill at a time.  You can also use Benadryl over the counter for itching or also to help with sleep.   TED HOSE STOCKINGS Wear the elastic stockings on both legs for three weeks following surgery during the day but you may remove then at night for sleeping.  MEDICATIONS See your medication summary on the "After Visit  Summary" that the nursing staff will review with you prior to discharge.  You may have some home medications which will be placed on hold until you complete the course of blood thinner medication.  It is important for you to complete the blood thinner medication as prescribed by your surgeon.  Continue your approved medications as instructed at time of discharge.  PRECAUTIONS If you experience chest pain or shortness of breath - call 911 immediately for transfer to the hospital emergency department.  If you develop a fever greater that 101 F, purulent drainage from wound, increased redness or drainage from wound, foul odor from the wound/dressing, or calf pain - CONTACT YOUR SURGEON.                                                   FOLLOW-UP APPOINTMENTS Make sure you keep all of your appointments after your operation with your surgeon and caregivers. You should call the office at  the above phone number and make an appointment for approximately two weeks after the date of your surgery or on the date instructed by your surgeon outlined in the "After Visit Summary".  RANGE OF MOTION AND STRENGTHENING EXERCISES  These exercises are designed to help you keep full movement of your hip joint. Follow your caregiver's or physical therapist's instructions. Perform all exercises about fifteen times, three times per day or as directed. Exercise both hips, even if you have had only one joint replacement. These exercises can be done on a training (exercise) mat, on the floor, on a table or on a bed. Use whatever works the best and is most comfortable for you. Use music or television while you are exercising so that the exercises are a pleasant break in your day. This will make your life better with the exercises acting as a break in routine you can look forward to.  Lying on your back, slowly slide your foot toward your buttocks, raising your knee up off the floor. Then slowly slide your foot back down until your leg  is straight again.  Lying on your back spread your legs as far apart as you can without causing discomfort.  Lying on your side, raise your upper leg and foot straight up from the floor as far as is comfortable. Slowly lower the leg and repeat.  Lying on your back, tighten up the muscle in the front of your thigh (quadriceps muscles). You can do this by keeping your leg straight and trying to raise your heel off the floor. This helps strengthen the largest muscle supporting your knee.  Lying on your back, tighten up the muscles of your buttocks both with the legs straight and with the knee bent at a comfortable angle while keeping your heel on the floor.   IF YOU ARE TRANSFERRED TO A SKILLED REHAB FACILITY If the patient is transferred to a skilled rehab facility following release from the hospital, a list of the current medications will be sent to the facility for the patient to continue.  When discharged from the skilled rehab facility, please have the facility set up the patient's Home Health Physical Therapy prior to being released. Also, the skilled facility will be responsible for providing the patient with their medications at time of release from the facility to include their pain medication, the muscle relaxants, and their blood thinner medication. If the patient is still at the rehab facility at time of the two week follow up appointment, the skilled rehab facility will also need to assist the patient in arranging follow up appointment in our office and any transportation needs.  MAKE SURE YOU:  Understand these instructions.  Get help right away if you are not doing well or get worse.    Pick up stool softner and laxative for home use following surgery while on pain medications. Do not submerge incision under water. Please use good hand washing techniques while changing dressing each day. May shower starting three days after surgery. Please use a clean towel to pat the incision dry  following showers. Continue to use ice for pain and swelling after surgery. Do not use any lotions or creams on the incision until instructed by your surgeon.   Do not sit on low chairs, stoools or toilet seats, as it may be difficult to get up from low surfaces   Complete by: As directed    Driving restrictions   Complete by: As directed    No driving for  two weeks   TED hose   Complete by: As directed    Use stockings (TED hose) for three weeks on both leg(s).  You may remove them at night for sleeping.   Weight bearing as tolerated   Complete by: As directed      Allergies as of 09/12/2018   No Known Allergies     Medication List    STOP taking these medications   acetaminophen 650 MG CR tablet Commonly known as: TYLENOL   Fish Oil 1200 MG Caps   Melatonin 10 MG Caps   OCUVITE PO   vitamin C 500 MG tablet Commonly known as: ASCORBIC ACID   Vitamin D 50 MCG (2000 UT) tablet     TAKE these medications   aspirin 325 MG EC tablet Take 1 tablet (325 mg total) by mouth 2 (two) times daily for 20 days. Take one tablet (325 mg) Aspirin two times a day for three weeks following surgery.Then take one baby Aspirin (81 mg) once a day for three weeks.Then discontinue aspirin. What changed:   medication strength  how much to take  when to take this  additional instructions   atorvastatin 40 MG tablet Commonly known as: LIPITOR Take 40 mg by mouth at bedtime.   HYDROcodone-acetaminophen 5-325 MG tablet Commonly known as: NORCO/VICODIN Take 1-2 tablets by mouth every 6 (six) hours as needed for severe pain.   lisinopril-hydrochlorothiazide 20-25 MG tablet Commonly known as: ZESTORETIC Take 1 tablet by mouth daily.   methocarbamol 500 MG tablet Commonly known as: ROBAXIN Take 1 tablet (500 mg total) by mouth every 6 (six) hours as needed for muscle spasms.   omeprazole 40 MG capsule Commonly known as: PRILOSEC Take 40 mg by mouth daily.   potassium gluconate 595  (99 K) MG Tabs tablet Take 1,190 mg by mouth daily.   traMADol 50 MG tablet Commonly known as: ULTRAM Take 1-2 tablets (50-100 mg total) by mouth every 6 (six) hours as needed for moderate pain.            Discharge Care Instructions  (From admission, onward)         Start     Ordered   09/12/18 0000  Weight bearing as tolerated     09/12/18 0726   09/12/18 0000  Change dressing    Comments: You may change your dressing on Thursday, then change the dressing daily with sterile 4 x 4 inch gauze dressing and paper tape.   09/12/18 0726         Follow-up Information    Ollen GrossAluisio, Frank, MD. Schedule an appointment as soon as possible for a visit on 09/26/2018.   Specialty: Orthopedic Surgery Contact information: [address] [place] [address] [phone]           Signed: Dennie Bibleshley Xiadani Damman, PA-C Orthopedic Surgery 09/15/2018, 8:11 PM

## 2019-06-06 ENCOUNTER — Other Ambulatory Visit: Payer: Self-pay | Admitting: Family Medicine

## 2019-06-06 DIAGNOSIS — Z1231 Encounter for screening mammogram for malignant neoplasm of breast: Secondary | ICD-10-CM

## 2019-06-11 ENCOUNTER — Other Ambulatory Visit: Payer: Self-pay

## 2019-06-11 ENCOUNTER — Ambulatory Visit
Admission: RE | Admit: 2019-06-11 | Discharge: 2019-06-11 | Disposition: A | Discharge status: Home / Self Care | Payer: Medicare PPO | Source: Ambulatory Visit | Attending: Family Medicine | Admitting: Family Medicine

## 2019-06-11 DIAGNOSIS — Z1231 Encounter for screening mammogram for malignant neoplasm of breast: Secondary | ICD-10-CM

## 2019-09-24 DIAGNOSIS — M1712 Unilateral primary osteoarthritis, left knee: Secondary | ICD-10-CM | POA: Diagnosis not present

## 2020-01-12 DIAGNOSIS — Z0000 Encounter for general adult medical examination without abnormal findings: Secondary | ICD-10-CM | POA: Diagnosis not present

## 2020-01-12 DIAGNOSIS — Z1159 Encounter for screening for other viral diseases: Secondary | ICD-10-CM | POA: Diagnosis not present

## 2020-01-12 DIAGNOSIS — Z1389 Encounter for screening for other disorder: Secondary | ICD-10-CM | POA: Diagnosis not present

## 2020-01-12 DIAGNOSIS — H547 Unspecified visual loss: Secondary | ICD-10-CM | POA: Diagnosis not present

## 2020-01-12 DIAGNOSIS — I10 Essential (primary) hypertension: Secondary | ICD-10-CM | POA: Diagnosis not present

## 2020-01-12 DIAGNOSIS — R7309 Other abnormal glucose: Secondary | ICD-10-CM | POA: Diagnosis not present

## 2020-01-12 DIAGNOSIS — E7800 Pure hypercholesterolemia, unspecified: Secondary | ICD-10-CM | POA: Diagnosis not present

## 2020-01-12 DIAGNOSIS — K219 Gastro-esophageal reflux disease without esophagitis: Secondary | ICD-10-CM | POA: Diagnosis not present

## 2020-04-20 DIAGNOSIS — H2513 Age-related nuclear cataract, bilateral: Secondary | ICD-10-CM | POA: Diagnosis not present

## 2020-04-20 DIAGNOSIS — H5203 Hypermetropia, bilateral: Secondary | ICD-10-CM | POA: Diagnosis not present

## 2020-04-20 DIAGNOSIS — H25013 Cortical age-related cataract, bilateral: Secondary | ICD-10-CM | POA: Diagnosis not present

## 2020-06-09 DIAGNOSIS — H25812 Combined forms of age-related cataract, left eye: Secondary | ICD-10-CM | POA: Diagnosis not present

## 2020-06-09 DIAGNOSIS — H2512 Age-related nuclear cataract, left eye: Secondary | ICD-10-CM | POA: Diagnosis not present

## 2020-07-12 DIAGNOSIS — K219 Gastro-esophageal reflux disease without esophagitis: Secondary | ICD-10-CM | POA: Diagnosis not present

## 2020-07-12 DIAGNOSIS — I10 Essential (primary) hypertension: Secondary | ICD-10-CM | POA: Diagnosis not present

## 2020-07-12 DIAGNOSIS — R7303 Prediabetes: Secondary | ICD-10-CM | POA: Diagnosis not present

## 2020-07-12 DIAGNOSIS — E7800 Pure hypercholesterolemia, unspecified: Secondary | ICD-10-CM | POA: Diagnosis not present

## 2021-01-17 DIAGNOSIS — K219 Gastro-esophageal reflux disease without esophagitis: Secondary | ICD-10-CM | POA: Diagnosis not present

## 2021-01-17 DIAGNOSIS — I10 Essential (primary) hypertension: Secondary | ICD-10-CM | POA: Diagnosis not present

## 2021-01-17 DIAGNOSIS — Z0000 Encounter for general adult medical examination without abnormal findings: Secondary | ICD-10-CM | POA: Diagnosis not present

## 2021-01-17 DIAGNOSIS — Z1389 Encounter for screening for other disorder: Secondary | ICD-10-CM | POA: Diagnosis not present

## 2021-01-17 DIAGNOSIS — E7800 Pure hypercholesterolemia, unspecified: Secondary | ICD-10-CM | POA: Diagnosis not present

## 2021-01-17 DIAGNOSIS — R7303 Prediabetes: Secondary | ICD-10-CM | POA: Diagnosis not present

## 2021-01-18 DIAGNOSIS — H2511 Age-related nuclear cataract, right eye: Secondary | ICD-10-CM | POA: Diagnosis not present

## 2021-01-18 DIAGNOSIS — Z961 Presence of intraocular lens: Secondary | ICD-10-CM | POA: Diagnosis not present

## 2021-03-31 ENCOUNTER — Other Ambulatory Visit: Payer: Self-pay | Admitting: Family Medicine

## 2021-03-31 DIAGNOSIS — Z1231 Encounter for screening mammogram for malignant neoplasm of breast: Secondary | ICD-10-CM

## 2021-04-08 ENCOUNTER — Other Ambulatory Visit: Payer: Self-pay

## 2021-04-08 ENCOUNTER — Ambulatory Visit
Admission: RE | Admit: 2021-04-08 | Discharge: 2021-04-08 | Disposition: A | Discharge status: Home / Self Care | Payer: Medicare PPO | Source: Ambulatory Visit | Attending: Family Medicine | Admitting: Family Medicine

## 2021-04-08 DIAGNOSIS — Z1231 Encounter for screening mammogram for malignant neoplasm of breast: Secondary | ICD-10-CM | POA: Diagnosis not present

## 2021-07-18 DIAGNOSIS — I10 Essential (primary) hypertension: Secondary | ICD-10-CM | POA: Diagnosis not present

## 2021-07-18 DIAGNOSIS — K219 Gastro-esophageal reflux disease without esophagitis: Secondary | ICD-10-CM | POA: Diagnosis not present

## 2021-07-18 DIAGNOSIS — M19049 Primary osteoarthritis, unspecified hand: Secondary | ICD-10-CM | POA: Diagnosis not present

## 2021-07-18 DIAGNOSIS — E7800 Pure hypercholesterolemia, unspecified: Secondary | ICD-10-CM | POA: Diagnosis not present

## 2021-07-18 DIAGNOSIS — R7303 Prediabetes: Secondary | ICD-10-CM | POA: Diagnosis not present

## 2021-07-29 DIAGNOSIS — H2511 Age-related nuclear cataract, right eye: Secondary | ICD-10-CM | POA: Diagnosis not present

## 2021-10-26 DIAGNOSIS — H2511 Age-related nuclear cataract, right eye: Secondary | ICD-10-CM | POA: Diagnosis not present

## 2021-10-26 DIAGNOSIS — H268 Other specified cataract: Secondary | ICD-10-CM | POA: Diagnosis not present

## 2021-10-26 DIAGNOSIS — H269 Unspecified cataract: Secondary | ICD-10-CM | POA: Diagnosis not present

## 2022-02-21 DIAGNOSIS — R7303 Prediabetes: Secondary | ICD-10-CM | POA: Diagnosis not present

## 2022-02-21 DIAGNOSIS — E7800 Pure hypercholesterolemia, unspecified: Secondary | ICD-10-CM | POA: Diagnosis not present

## 2022-02-21 DIAGNOSIS — Z1331 Encounter for screening for depression: Secondary | ICD-10-CM | POA: Diagnosis not present

## 2022-02-21 DIAGNOSIS — Z0000 Encounter for general adult medical examination without abnormal findings: Secondary | ICD-10-CM | POA: Diagnosis not present

## 2022-02-21 DIAGNOSIS — I10 Essential (primary) hypertension: Secondary | ICD-10-CM | POA: Diagnosis not present

## 2022-02-21 DIAGNOSIS — K219 Gastro-esophageal reflux disease without esophagitis: Secondary | ICD-10-CM | POA: Diagnosis not present

## 2022-02-21 DIAGNOSIS — M810 Age-related osteoporosis without current pathological fracture: Secondary | ICD-10-CM | POA: Diagnosis not present

## 2022-02-21 DIAGNOSIS — Z1211 Encounter for screening for malignant neoplasm of colon: Secondary | ICD-10-CM | POA: Diagnosis not present

## 2022-02-23 DIAGNOSIS — Z1211 Encounter for screening for malignant neoplasm of colon: Secondary | ICD-10-CM | POA: Diagnosis not present

## 2022-04-18 ENCOUNTER — Other Ambulatory Visit: Payer: Self-pay | Admitting: Family Medicine

## 2022-04-18 DIAGNOSIS — Z1231 Encounter for screening mammogram for malignant neoplasm of breast: Secondary | ICD-10-CM

## 2022-04-20 ENCOUNTER — Ambulatory Visit
Admission: RE | Admit: 2022-04-20 | Discharge: 2022-04-20 | Disposition: A | Discharge status: Home / Self Care | Payer: Medicare PPO | Source: Ambulatory Visit | Attending: Family Medicine | Admitting: Family Medicine

## 2022-04-20 DIAGNOSIS — Z1231 Encounter for screening mammogram for malignant neoplasm of breast: Secondary | ICD-10-CM

## 2022-05-30 DIAGNOSIS — Z961 Presence of intraocular lens: Secondary | ICD-10-CM | POA: Diagnosis not present

## 2022-08-22 DIAGNOSIS — E7800 Pure hypercholesterolemia, unspecified: Secondary | ICD-10-CM | POA: Diagnosis not present

## 2022-08-22 DIAGNOSIS — I10 Essential (primary) hypertension: Secondary | ICD-10-CM | POA: Diagnosis not present

## 2022-08-22 DIAGNOSIS — M810 Age-related osteoporosis without current pathological fracture: Secondary | ICD-10-CM | POA: Diagnosis not present

## 2022-08-22 DIAGNOSIS — R413 Other amnesia: Secondary | ICD-10-CM | POA: Diagnosis not present

## 2022-08-22 DIAGNOSIS — R7303 Prediabetes: Secondary | ICD-10-CM | POA: Diagnosis not present

## 2022-08-22 DIAGNOSIS — K219 Gastro-esophageal reflux disease without esophagitis: Secondary | ICD-10-CM | POA: Diagnosis not present

## 2022-08-22 DIAGNOSIS — E559 Vitamin D deficiency, unspecified: Secondary | ICD-10-CM | POA: Diagnosis not present

## 2022-09-11 DIAGNOSIS — R413 Other amnesia: Secondary | ICD-10-CM | POA: Diagnosis not present

## 2023-03-15 DIAGNOSIS — R413 Other amnesia: Secondary | ICD-10-CM | POA: Diagnosis not present

## 2023-03-15 DIAGNOSIS — Z23 Encounter for immunization: Secondary | ICD-10-CM | POA: Diagnosis not present

## 2023-03-15 DIAGNOSIS — K219 Gastro-esophageal reflux disease without esophagitis: Secondary | ICD-10-CM | POA: Diagnosis not present

## 2023-03-15 DIAGNOSIS — I10 Essential (primary) hypertension: Secondary | ICD-10-CM | POA: Diagnosis not present

## 2023-03-15 DIAGNOSIS — Z1331 Encounter for screening for depression: Secondary | ICD-10-CM | POA: Diagnosis not present

## 2023-03-15 DIAGNOSIS — E7800 Pure hypercholesterolemia, unspecified: Secondary | ICD-10-CM | POA: Diagnosis not present

## 2023-03-15 DIAGNOSIS — Z0000 Encounter for general adult medical examination without abnormal findings: Secondary | ICD-10-CM | POA: Diagnosis not present

## 2023-03-15 DIAGNOSIS — M810 Age-related osteoporosis without current pathological fracture: Secondary | ICD-10-CM | POA: Diagnosis not present

## 2023-03-15 DIAGNOSIS — R7303 Prediabetes: Secondary | ICD-10-CM | POA: Diagnosis not present

## 2023-03-30 DIAGNOSIS — D72829 Elevated white blood cell count, unspecified: Secondary | ICD-10-CM | POA: Diagnosis not present

## 2023-07-08 IMAGING — MG MM DIGITAL SCREENING BILAT W/ TOMO AND CAD
8 series · 8 of 24 positions shown · non-contrast
Comparison: Previous exam(s).

CLINICAL DATA: Screening.

EXAM:
DIGITAL SCREENING BILATERAL MAMMOGRAM WITH TOMOSYNTHESIS AND CAD
TECHNIQUE: Bilateral screening digital craniocaudal and mediolateral oblique
mammograms were obtained. Bilateral screening digital breast
tomosynthesis was performed. The images were evaluated with
computer-aided detection.

[L CC synth-2D]
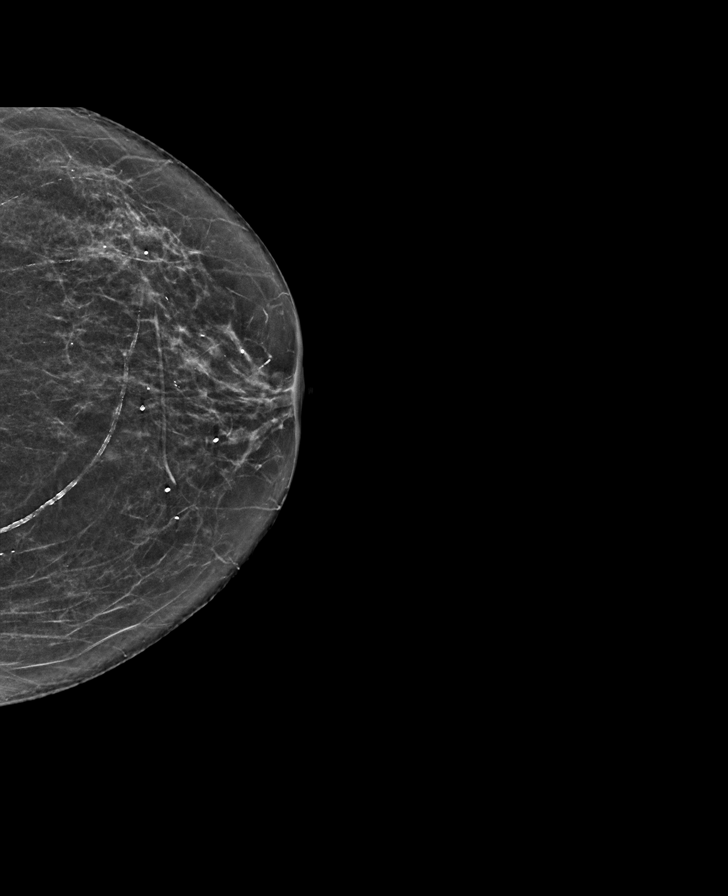

[R CC synth-2D]
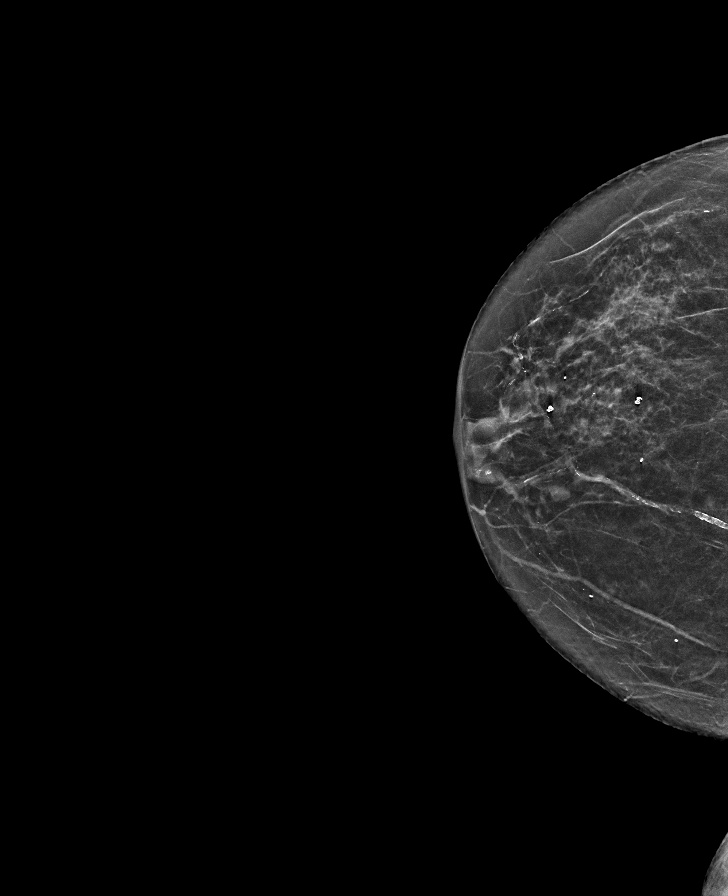

[R MLO synth-2D]
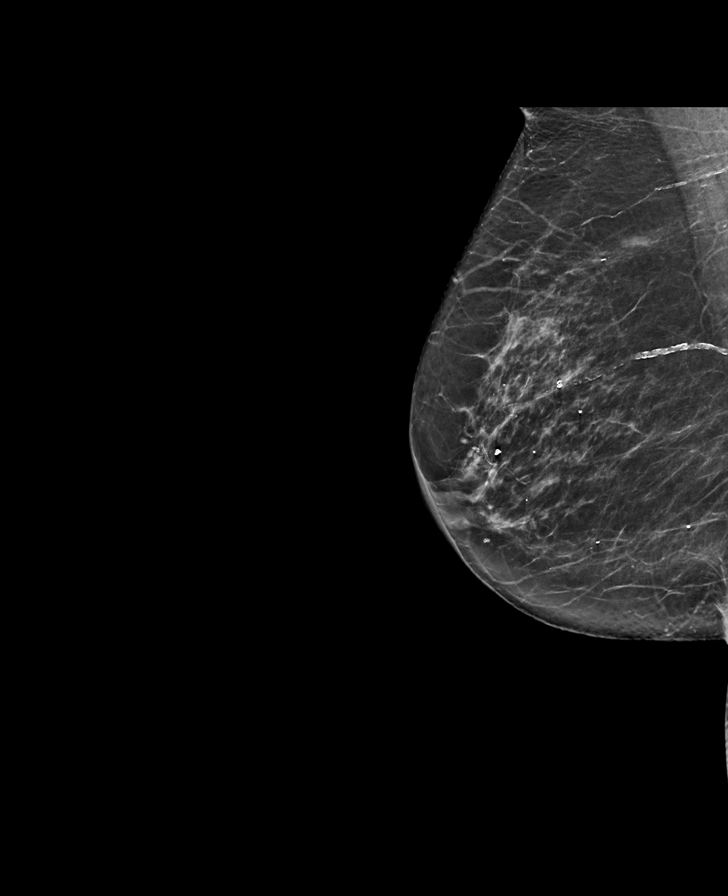

[L MLO synth-2D]
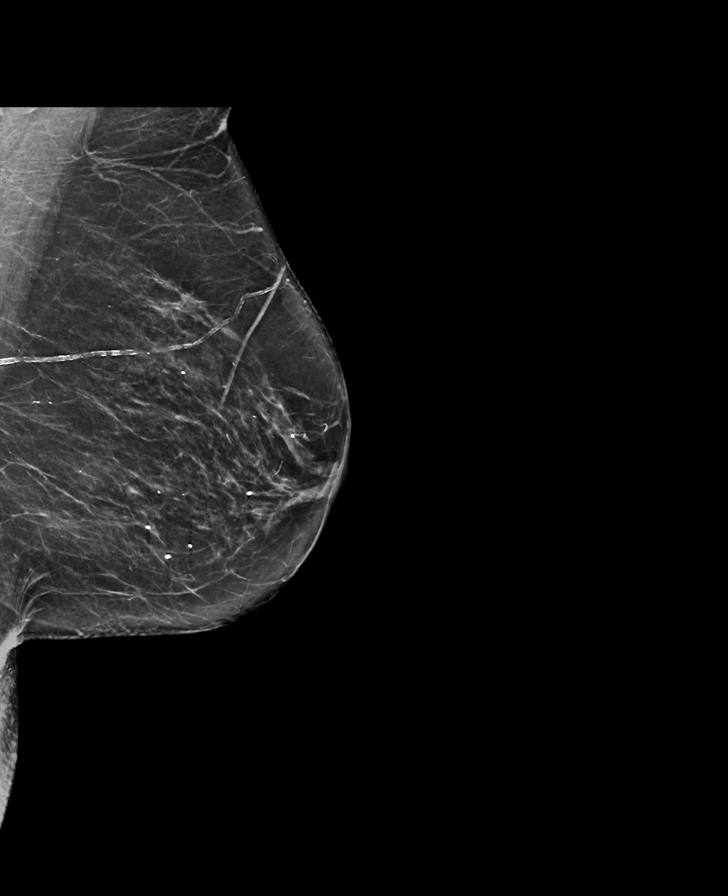

[R MLO tomo · tomo slice 33/66.0]
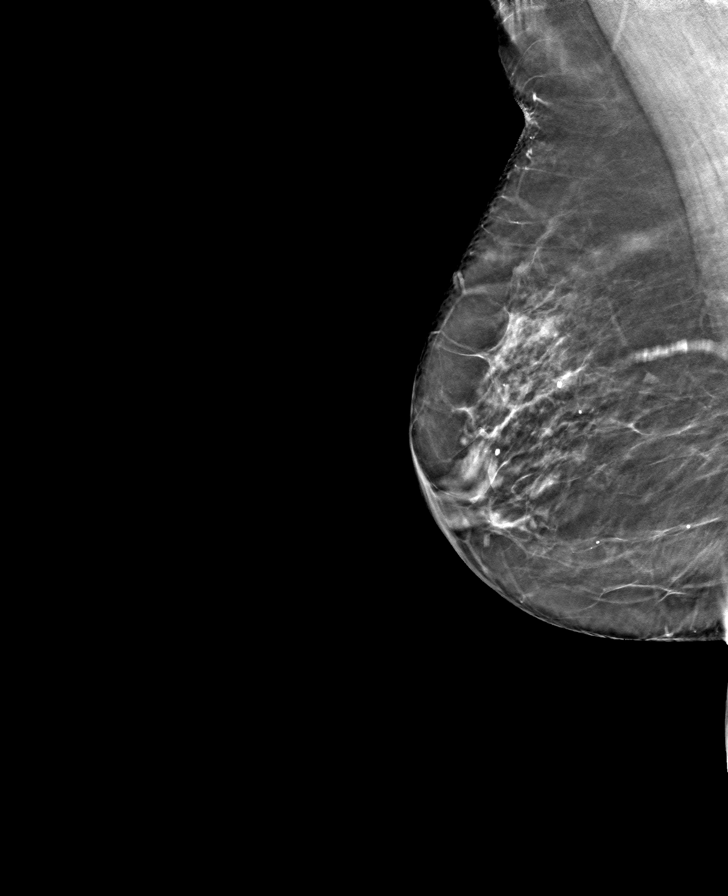

[L MLO tomo · tomo slice 37/72.0]
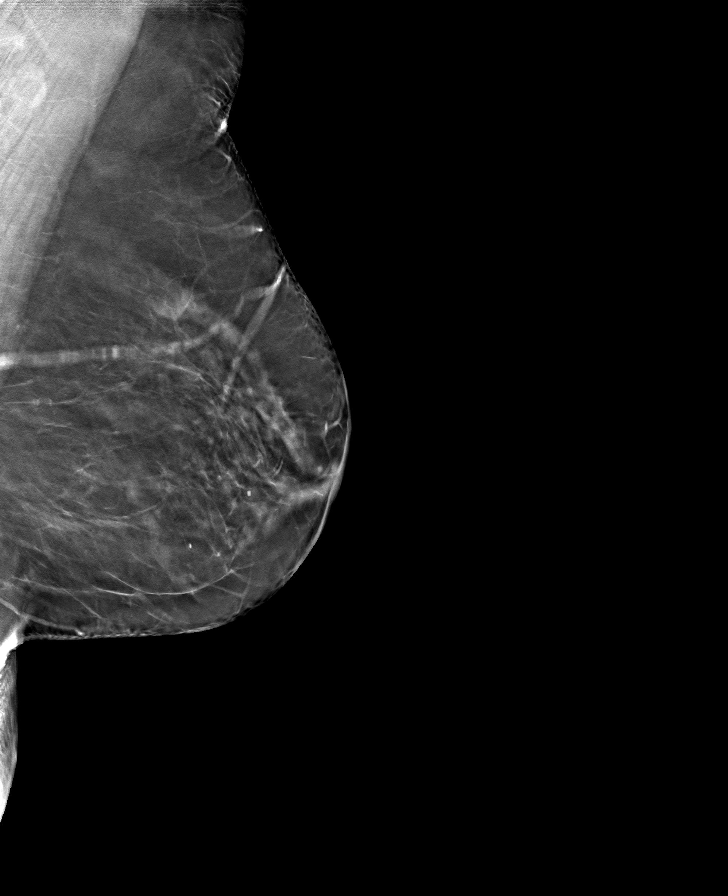

[R CC tomo · tomo slice 29/57.0]
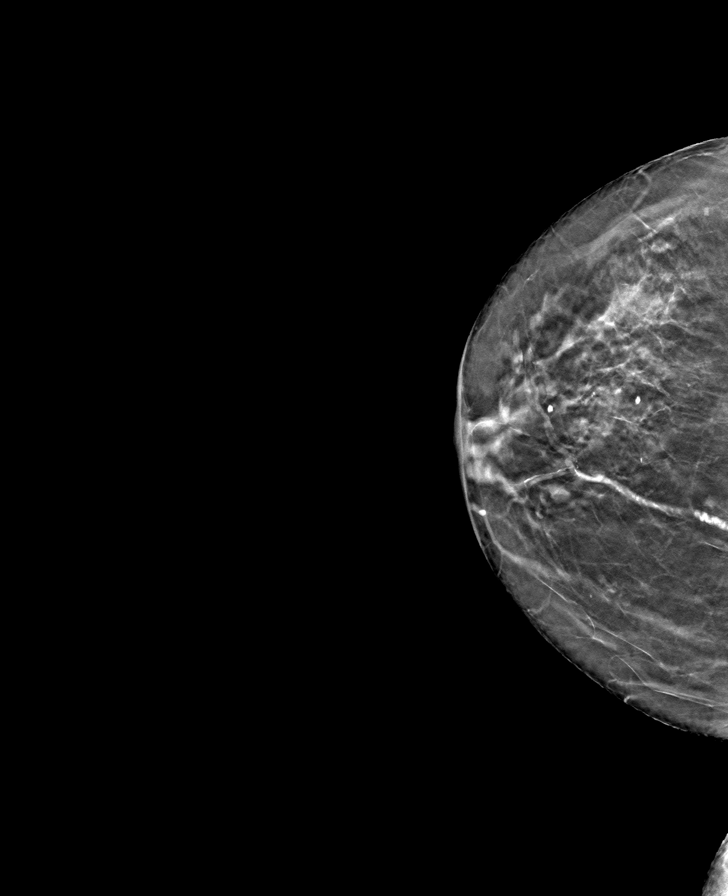

[L CC tomo · tomo slice 30/59.0]
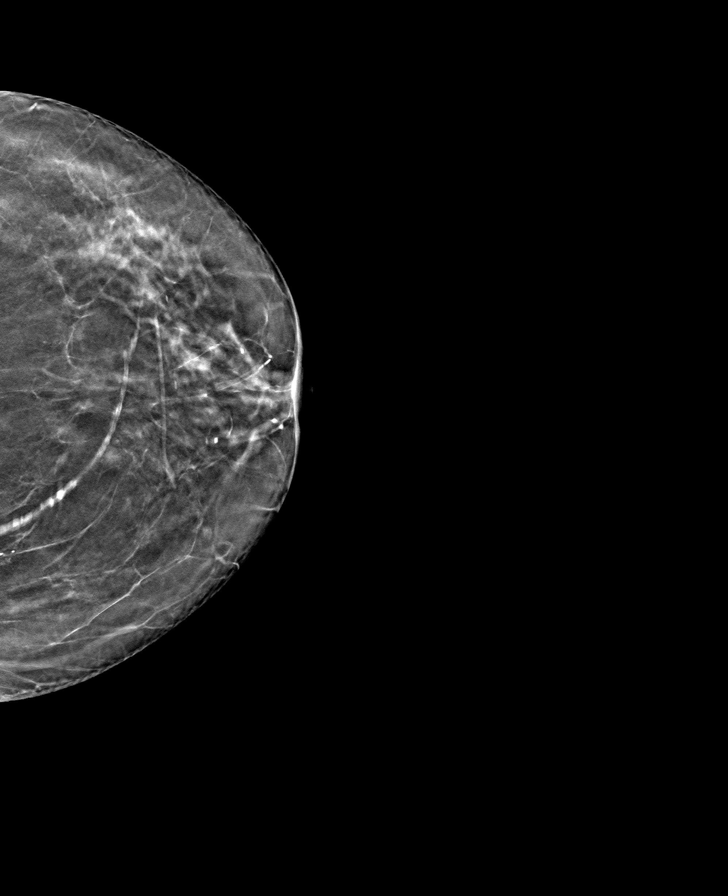

[8 of 24 positions shown; findings below may reference images not displayed]

ACR Breast Density Category b: There are scattered areas of
fibroglandular density.
FINDINGS: There are no findings suspicious for malignancy.
IMPRESSION: No mammographic evidence of malignancy. A result letter of this
screening mammogram will be mailed directly to the patient.

RECOMMENDATION:
Screening mammogram in one year. (Code:51-O-LD2)

BI-RADS CATEGORY  1: Negative.

## 2023-09-12 DIAGNOSIS — I10 Essential (primary) hypertension: Secondary | ICD-10-CM | POA: Diagnosis not present

## 2023-09-12 DIAGNOSIS — L989 Disorder of the skin and subcutaneous tissue, unspecified: Secondary | ICD-10-CM | POA: Diagnosis not present

## 2023-09-12 DIAGNOSIS — E7800 Pure hypercholesterolemia, unspecified: Secondary | ICD-10-CM | POA: Diagnosis not present

## 2023-09-12 DIAGNOSIS — R7303 Prediabetes: Secondary | ICD-10-CM | POA: Diagnosis not present

## 2023-09-12 DIAGNOSIS — M810 Age-related osteoporosis without current pathological fracture: Secondary | ICD-10-CM | POA: Diagnosis not present

## 2023-09-12 DIAGNOSIS — R413 Other amnesia: Secondary | ICD-10-CM | POA: Diagnosis not present

## 2023-09-12 DIAGNOSIS — K219 Gastro-esophageal reflux disease without esophagitis: Secondary | ICD-10-CM | POA: Diagnosis not present

## 2023-09-19 DIAGNOSIS — E876 Hypokalemia: Secondary | ICD-10-CM | POA: Diagnosis not present

## 2023-09-26 DIAGNOSIS — E876 Hypokalemia: Secondary | ICD-10-CM | POA: Diagnosis not present

## 2023-10-15 DIAGNOSIS — E876 Hypokalemia: Secondary | ICD-10-CM | POA: Diagnosis not present

## 2023-11-08 DIAGNOSIS — E876 Hypokalemia: Secondary | ICD-10-CM | POA: Diagnosis not present

## 2023-12-12 ENCOUNTER — Other Ambulatory Visit: Payer: Self-pay

## 2023-12-12 ENCOUNTER — Inpatient Hospital Stay (HOSPITAL_COMMUNITY)
Admission: EM | Admit: 2023-12-12 | Discharge: 2023-12-19 | DRG: 564 | Disposition: A | Discharge status: Skilled Nursing Facility | Attending: Student | Admitting: Student

## 2023-12-12 ENCOUNTER — Emergency Department (HOSPITAL_COMMUNITY)

## 2023-12-12 ENCOUNTER — Encounter (HOSPITAL_COMMUNITY): Payer: Self-pay | Admitting: Emergency Medicine

## 2023-12-12 DIAGNOSIS — Z751 Person awaiting admission to adequate facility elsewhere: Secondary | ICD-10-CM

## 2023-12-12 DIAGNOSIS — I119 Hypertensive heart disease without heart failure: Secondary | ICD-10-CM | POA: Diagnosis present

## 2023-12-12 DIAGNOSIS — I10 Essential (primary) hypertension: Secondary | ICD-10-CM | POA: Diagnosis not present

## 2023-12-12 DIAGNOSIS — E66811 Obesity, class 1: Secondary | ICD-10-CM | POA: Diagnosis present

## 2023-12-12 DIAGNOSIS — S060XAA Concussion with loss of consciousness status unknown, initial encounter: Secondary | ICD-10-CM | POA: Diagnosis not present

## 2023-12-12 DIAGNOSIS — N3000 Acute cystitis without hematuria: Secondary | ICD-10-CM | POA: Diagnosis present

## 2023-12-12 DIAGNOSIS — J9601 Acute respiratory failure with hypoxia: Secondary | ICD-10-CM | POA: Diagnosis present

## 2023-12-12 DIAGNOSIS — I700 Atherosclerosis of aorta: Secondary | ICD-10-CM | POA: Diagnosis not present

## 2023-12-12 DIAGNOSIS — T796XXA Traumatic ischemia of muscle, initial encounter: Secondary | ICD-10-CM | POA: Diagnosis not present

## 2023-12-12 DIAGNOSIS — E860 Dehydration: Secondary | ICD-10-CM | POA: Diagnosis present

## 2023-12-12 DIAGNOSIS — Z8249 Family history of ischemic heart disease and other diseases of the circulatory system: Secondary | ICD-10-CM

## 2023-12-12 DIAGNOSIS — B9620 Unspecified Escherichia coli [E. coli] as the cause of diseases classified elsewhere: Secondary | ICD-10-CM | POA: Diagnosis present

## 2023-12-12 DIAGNOSIS — M79642 Pain in left hand: Secondary | ICD-10-CM | POA: Diagnosis not present

## 2023-12-12 DIAGNOSIS — Z833 Family history of diabetes mellitus: Secondary | ICD-10-CM

## 2023-12-12 DIAGNOSIS — Z6830 Body mass index (BMI) 30.0-30.9, adult: Secondary | ICD-10-CM

## 2023-12-12 DIAGNOSIS — E785 Hyperlipidemia, unspecified: Secondary | ICD-10-CM | POA: Diagnosis present

## 2023-12-12 DIAGNOSIS — M1712 Unilateral primary osteoarthritis, left knee: Secondary | ICD-10-CM | POA: Diagnosis not present

## 2023-12-12 DIAGNOSIS — Y92009 Unspecified place in unspecified non-institutional (private) residence as the place of occurrence of the external cause: Secondary | ICD-10-CM

## 2023-12-12 DIAGNOSIS — Z043 Encounter for examination and observation following other accident: Secondary | ICD-10-CM | POA: Diagnosis not present

## 2023-12-12 DIAGNOSIS — L89152 Pressure ulcer of sacral region, stage 2: Secondary | ICD-10-CM | POA: Diagnosis present

## 2023-12-12 DIAGNOSIS — Z96651 Presence of right artificial knee joint: Secondary | ICD-10-CM | POA: Diagnosis present

## 2023-12-12 DIAGNOSIS — R519 Headache, unspecified: Secondary | ICD-10-CM | POA: Diagnosis not present

## 2023-12-12 DIAGNOSIS — L8990 Pressure ulcer of unspecified site, unspecified stage: Secondary | ICD-10-CM | POA: Insufficient documentation

## 2023-12-12 DIAGNOSIS — W19XXXA Unspecified fall, initial encounter: Secondary | ICD-10-CM | POA: Diagnosis present

## 2023-12-12 DIAGNOSIS — M810 Age-related osteoporosis without current pathological fracture: Secondary | ICD-10-CM | POA: Diagnosis present

## 2023-12-12 DIAGNOSIS — I517 Cardiomegaly: Secondary | ICD-10-CM | POA: Diagnosis not present

## 2023-12-12 DIAGNOSIS — Z801 Family history of malignant neoplasm of trachea, bronchus and lung: Secondary | ICD-10-CM

## 2023-12-12 DIAGNOSIS — M542 Cervicalgia: Secondary | ICD-10-CM | POA: Diagnosis not present

## 2023-12-12 DIAGNOSIS — M6282 Rhabdomyolysis: Secondary | ICD-10-CM | POA: Diagnosis present

## 2023-12-12 DIAGNOSIS — E861 Hypovolemia: Secondary | ICD-10-CM | POA: Diagnosis present

## 2023-12-12 DIAGNOSIS — E876 Hypokalemia: Secondary | ICD-10-CM | POA: Diagnosis present

## 2023-12-12 DIAGNOSIS — M47812 Spondylosis without myelopathy or radiculopathy, cervical region: Secondary | ICD-10-CM | POA: Diagnosis not present

## 2023-12-12 DIAGNOSIS — Z90711 Acquired absence of uterus with remaining cervical stump: Secondary | ICD-10-CM

## 2023-12-12 DIAGNOSIS — K5900 Constipation, unspecified: Secondary | ICD-10-CM | POA: Diagnosis present

## 2023-12-12 DIAGNOSIS — S60222A Contusion of left hand, initial encounter: Secondary | ICD-10-CM | POA: Diagnosis present

## 2023-12-12 DIAGNOSIS — Z96641 Presence of right artificial hip joint: Secondary | ICD-10-CM | POA: Diagnosis not present

## 2023-12-12 DIAGNOSIS — M1612 Unilateral primary osteoarthritis, left hip: Secondary | ICD-10-CM | POA: Diagnosis not present

## 2023-12-12 DIAGNOSIS — Z66 Do not resuscitate: Secondary | ICD-10-CM | POA: Diagnosis present

## 2023-12-12 DIAGNOSIS — N390 Urinary tract infection, site not specified: Secondary | ICD-10-CM | POA: Diagnosis present

## 2023-12-12 LAB — CBC WITH DIFFERENTIAL/PLATELET
Abs Immature Granulocytes: 0.35 K/uL — ABNORMAL HIGH (ref 0.00–0.07)
Basophils Absolute: 0.1 K/uL (ref 0.0–0.1)
Basophils Relative: 0 %
Eosinophils Absolute: 0 K/uL (ref 0.0–0.5)
Eosinophils Relative: 0 %
HCT: 51.2 % — ABNORMAL HIGH (ref 36.0–46.0)
Hemoglobin: 16.2 g/dL — ABNORMAL HIGH (ref 12.0–15.0)
Immature Granulocytes: 1 %
Lymphocytes Relative: 5 %
Lymphs Abs: 1.6 K/uL (ref 0.7–4.0)
MCH: 29.1 pg (ref 26.0–34.0)
MCHC: 31.6 g/dL (ref 30.0–36.0)
MCV: 92.1 fL (ref 80.0–100.0)
Monocytes Absolute: 2.2 K/uL — ABNORMAL HIGH (ref 0.1–1.0)
Monocytes Relative: 7 %
Neutro Abs: 26.8 K/uL — ABNORMAL HIGH (ref 1.7–7.7)
Neutrophils Relative %: 87 %
Platelets: 367 K/uL (ref 150–400)
RBC: 5.56 MIL/uL — ABNORMAL HIGH (ref 3.87–5.11)
RDW: 15 % (ref 11.5–15.5)
Smear Review: NORMAL
WBC: 31 K/uL — ABNORMAL HIGH (ref 4.0–10.5)
nRBC: 0.1 % (ref 0.0–0.2)

## 2023-12-12 LAB — URINALYSIS, ROUTINE W REFLEX MICROSCOPIC
Bilirubin Urine: NEGATIVE
Glucose, UA: NEGATIVE mg/dL
Hgb urine dipstick: NEGATIVE
Ketones, ur: 20 mg/dL — AB
Nitrite: NEGATIVE
Protein, ur: >=300 mg/dL — AB
Specific Gravity, Urine: 1.02 (ref 1.005–1.030)
pH: 5 (ref 5.0–8.0)

## 2023-12-12 LAB — COMPREHENSIVE METABOLIC PANEL WITH GFR
ALT: 28 U/L (ref 0–44)
AST: 63 U/L — ABNORMAL HIGH (ref 15–41)
Albumin: 4.2 g/dL (ref 3.5–5.0)
Alkaline Phosphatase: 86 U/L (ref 38–126)
Anion gap: 15 (ref 5–15)
BUN: 29 mg/dL — ABNORMAL HIGH (ref 8–23)
CO2: 28 mmol/L (ref 22–32)
Calcium: 10.9 mg/dL — ABNORMAL HIGH (ref 8.9–10.3)
Chloride: 98 mmol/L (ref 98–111)
Creatinine, Ser: 0.48 mg/dL (ref 0.44–1.00)
GFR, Estimated: >60 mL/min (ref >60)
Glucose, Bld: 135 mg/dL — ABNORMAL HIGH (ref 70–99)
Potassium: 3.9 mmol/L (ref 3.5–5.1)
Sodium: 141 mmol/L (ref 135–145)
Total Bilirubin: 1.2 mg/dL (ref 0.0–1.2)
Total Protein: 7.7 g/dL (ref 6.5–8.1)

## 2023-12-12 LAB — PROTIME-INR
INR: 0.9 (ref 0.8–1.2)
Prothrombin Time: 12.8 s (ref 11.4–15.2)

## 2023-12-12 LAB — CK: Total CK: 898 U/L — ABNORMAL HIGH (ref 38–234)

## 2023-12-12 MED ORDER — ONDANSETRON HCL 4 MG/2ML IJ SOLN
4.0000 mg | Freq: Once | INTRAMUSCULAR | Status: AC
  Administered 2023-12-12: 4 mg via INTRAVENOUS
  Filled 2023-12-12: qty 2

## 2023-12-12 MED ORDER — ONDANSETRON HCL 4 MG PO TABS: 4.0000 mg | ORAL_TABLET | Freq: Four times a day (QID) | ORAL | Status: DC | PRN

## 2023-12-12 MED ORDER — ENOXAPARIN SODIUM 40 MG/0.4ML IJ SOSY
40.0000 mg | PREFILLED_SYRINGE | INTRAMUSCULAR | Status: DC
  Administered 2023-12-13 – 2023-12-19 (×7): 40 mg via SUBCUTANEOUS
  Filled 2023-12-12 (×7): qty 0.4

## 2023-12-12 MED ORDER — SODIUM CHLORIDE 0.9 % IV SOLN: INTRAVENOUS | Status: AC

## 2023-12-12 MED ORDER — CEFTRIAXONE SODIUM 1 G IJ SOLR
1.0000 g | Freq: Once | INTRAMUSCULAR | Status: AC
  Administered 2023-12-12: 1 g via INTRAVENOUS
  Filled 2023-12-12: qty 10

## 2023-12-12 MED ORDER — SENNOSIDES-DOCUSATE SODIUM 8.6-50 MG PO TABS: 1.0000 | ORAL_TABLET | Freq: Every evening | ORAL | Status: DC | PRN

## 2023-12-12 MED ORDER — ACETAMINOPHEN 325 MG PO TABS
650.0000 mg | ORAL_TABLET | Freq: Four times a day (QID) | ORAL | Status: DC | PRN
  Administered 2023-12-13: 650 mg via ORAL
  Filled 2023-12-12: qty 2

## 2023-12-12 MED ORDER — SODIUM CHLORIDE 0.9 % IV SOLN
1.0000 g | INTRAVENOUS | Status: DC
  Administered 2023-12-13 – 2023-12-14 (×2): 1 g via INTRAVENOUS
  Filled 2023-12-12 (×2): qty 10

## 2023-12-12 MED ORDER — SODIUM CHLORIDE 0.9 % IV BOLUS
1000.0000 mL | Freq: Once | INTRAVENOUS | Status: AC
  Administered 2023-12-12: 1000 mL via INTRAVENOUS

## 2023-12-12 MED ORDER — ONDANSETRON HCL 4 MG/2ML IJ SOLN: 4.0000 mg | Freq: Four times a day (QID) | INTRAMUSCULAR | Status: DC | PRN

## 2023-12-12 MED ORDER — ACETAMINOPHEN 650 MG RE SUPP: 650.0000 mg | Freq: Four times a day (QID) | RECTAL | Status: DC | PRN

## 2023-12-12 MED ORDER — MORPHINE SULFATE (PF) 4 MG/ML IV SOLN
4.0000 mg | INTRAVENOUS | Status: DC | PRN
  Administered 2023-12-12: 4 mg via INTRAVENOUS
  Filled 2023-12-12 (×2): qty 1

## 2023-12-12 NOTE — ED Notes (Signed)
 Pt clothes cut off. Pt log rolled to look at back. Sacral wound noted.

## 2023-12-12 NOTE — Hospital Course (Addendum)
 Lisa Ward is a 78 y.o. female with medical history significant for HTN, HLD who is admitted with elevated CK/early rhabdomyolysis after a fall at home with prolonged downtime.  Improved, waiting for STR.

## 2023-12-12 NOTE — H&P (Addendum)
 History and Physical    LOUGENIA Ward FMW:[id number] DOB: 05-04-1945 DOA: 12/12/2023  PCP: Claudene Pellet, MD  Patient coming from: Home  I have personally briefly reviewed patient's old medical records in [facility]  Chief Complaint: Fall at home, stuck on floor for 3 days  HPI: Lisa Ward is a 78 y.o. female with medical history significant for HTN, HLD who presented to the ED for evaluation after a fall at home with prolonged downtime.  Patient lives alone.  She states that she was returning home after getting her hair done on Monday (11/3).  She says she hit her head on the car door when she was getting out of the car and going into the house.  She says when she got in the house she fell from being dizzy after she hit her head.  She says that since then she was stuck on the ground until earlier today (Wednesday 11/5) when her son went to go check on her after he could not reach her.  She says that she does not believe that she lost consciousness but does admit that she has some memory issues.  She reports chronic bilateral knee and lower back pain which she does not feel is changed from her baseline.  She feels generally weak.  She reports burning with urination.  She denies chest pain, dyspnea, fevers, chills, diaphoresis.  Patient states she normally ambulates with use of a walker and states that she did have it with her at the time of her fall.  Initially on exam she was feeling a little bit confused but quickly mentation improved and was answering questions appropriately.  She is oriented to self, place, year, and situation.  ED Course  Labs/Imaging on admission: I have personally reviewed following labs and imaging studies.  Initial vitals showed BP 151/106, pulse 88, RR 15, temp 98.0 F, SpO2 92% on room air.  Labs showed WBC 31.0, hemoglobin 16.2, platelets 367, sodium 141, potassium 3.9, bicarb 28, BUN 29, creatinine 0.48, serum glucose 135, albumin  4.2,  calcium  10.9, AST 63, ALT 28, alk phos 86, total bilirubin 1.2.  CK 898.  Urinalysis showed negative nitrites, small leukocytes, 0-5 RBCs, 21-50 WBCs, many bacteria.  Extensive traumatic imaging was negative for obvious injury.  Right knee x-ray did note findings questionable for subtle nondisplaced fibular head fracture.  Patient was given 1 L normal saline, IV ceftriaxone, and IV morphine  4 mg.  The hospitalist service was consulted for admission.  Review of Systems: All systems reviewed and are negative except as documented in history of present illness above.   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   Procedure: TOTAL HIP ARTHROPLASTY ANTERIOR APPROACH;  Surgeon: Melodi Lerner, MD;  Location: [facility];  Service: Orthopedics;  Laterality: Right;    TOTAL KNEE ARTHROPLASTY Right     Social History:  Social History   Tobacco Use   Smoking status: Never   Smokeless tobacco: Never  Vaping Use   Vaping status: Never Used  Substance Use Topics   Alcohol use: Not Currently   Drug use: Not Currently   No Known Allergies  Family History  Problem Relation Age of Onset   Hypertension Mother    Dementia Mother    Heart disease Father    Lung cancer Father    Heart disease Other    Diabetes Other    Cancer Other    Arthritis Other      Prior to Admission medications   Medication Sig Start Date End Date Taking? Authorizing Provider  atorvastatin  (LIPITOR) 40 MG tablet Take 40 mg  by mouth at bedtime.    [provider]  HYDROcodone -acetaminophen  (NORCO/VICODIN) 5-325 MG tablet Take 1-2 tablets by mouth every 6 (six) hours as needed for severe pain. 09/12/18   Patti Rosina SAUNDERS, PA-C  lisinopril-hydrochlorothiazide (ZESTORETIC) 20-25 MG tablet Take 1 tablet by mouth daily.    [provider]  methocarbamol  (ROBAXIN ) 500 MG tablet Take 1 tablet (500 mg total) by mouth every 6 (six) hours as needed for muscle spasms. 09/12/18   Patti Rosina SAUNDERS, PA-C  omeprazole (PRILOSEC) 40 MG capsule Take 40 mg by mouth daily.    [provider]  potassium gluconate 595 (99 K) MG TABS tablet Take 1,190 mg by mouth daily.    [provider]  traMADol  (ULTRAM ) 50 MG tablet Take 1-2 tablets (50-100 mg total) by mouth every 6 (six) hours as needed for moderate pain. 09/12/18   Patti Rosina SAUNDERS, PA-C    Physical Exam: Vitals:   12/12/23 2057 12/12/23 2245 12/12/23 2300 12/12/23 2315  BP: (!) 153/107 138/62 120/65 (!) 123/53  Pulse: 86 78 78 78  Resp: 20 (!) 23 (!) 21 (!) 22  Temp: 98 F (36.7 C)     TempSrc: Oral     SpO2: (!) 89% 99% 98% 99%   Constitutional: Resting supine in bed, NAD, calm, comfortable Eyes: EOMI, lids and conjunctivae normal ENMT: Mucous membranes are dry. Posterior pharynx clear of any exudate or lesions.Normal dentition.  Neck: normal, supple, no masses. Respiratory: clear to auscultation bilaterally, no wheezing, no crackles. Normal respiratory effort. No accessory muscle use.  Cardiovascular: Regular rate and rhythm, systolic murmur. No extremity edema. 2+ pedal pulses. Abdomen: Soft, no tenderness, no masses palpated. Musculoskeletal: no clubbing / cyanosis. No joint deformity upper and lower extremities.  No point tenderness over knees.  No contractures. Normal muscle tone.  Skin: no rashes, lesions, ulcers. No induration Neurologic: Sensation intact. Strength 5/5 both upper extremities, 3/5 both lower  extremities. Psychiatric: Alert and oriented x 3. Normal mood.   EKG: Personally reviewed. Sinus rhythm, rate 82, incomplete RBBB, motion artifact throughout.  Assessment/Plan Principal Problem:   Rhabdomyolysis Active Problems:   Fall at home, initial encounter   Hypercalcemia   HTN (hypertension)   HLD (hyperlipidemia)   ELVERTA DIMICELI is a 78 y.o. female with medical history significant for HTN, HLD who is admitted with elevated CK/early rhabdomyolysis after a fall at home with prolonged downtime.  Assessment and Plan: Fall at home with prolonged downtime/elevated CK: Patient fell at home on 11/3 with approximately 48-hour downtime. CK 898 suggestive of early rhabdomyolysis. Extensive trauma imaging with question of subtle nondisplaced fibular head fracture otherwise no acute injury.   - Continue IV fluid hydration overnight - Monitor renal function, UOP - Repeat CK level  in a.m. - PT/OT eval, fall precautions  Urinary tract infection: UA is abnormal.  Patient does report dysuria.  Significant leukocytosis noted although likely some component of hemoconcentration. - Continue IV ceftriaxone - Add on urine culture  Hypercalcemia: Calcium  10.9 on admission.  Secondary to hypovolemia.  Continue IV fluids and repeat labs in AM.  Hypertension: BP currently stable.  Med rec is pending, holding antihypertensives for now.  Hyperlipidemia: Holding statin.   DVT prophylaxis: enoxaparin (LOVENOX) injection 40 mg Start: 12/13/23 1000 Code Status:   Code Status: Limited: Do not attempt resuscitation (DNR) -DNR-LIMITED -Do Not Intubate/DNI discussed with patient on admission. Family Communication: Discussed with patient, she has discussed with family Disposition Plan: From home, dispo pending clinical progress Consults called: None Severity of Illness: The appropriate patient status for this patient is OBSERVATION. Observation status is judged to be reasonable and necessary in  order to provide the required intensity of service to ensure the patient's safety. The patient's presenting symptoms, physical exam findings, and initial radiographic and laboratory data in the context of their medical condition is felt to place them at decreased risk for further clinical deterioration. Furthermore, it is anticipated that the patient will be medically stable for discharge from the hospital within 2 midnights of admission.   Jorie Blanch MD [facility]  If 7PM-7AM, please contact night-coverage [email or web address]  12/12/2023, 11:47 PM

## 2023-12-12 NOTE — ED Notes (Addendum)
 Pt O2 sat sitting between 88%-89% RA, RN placed pt on 2L [place], pt only came up to 90% after 2 minutes, RN incresased O2 to 3L [place], pt maintaining sats 96%-98%. MD Dean made aware.

## 2023-12-12 NOTE — ED Provider Notes (Signed)
 [place] EMERGENCY DEPARTMENT AT [facility] Provider Note   CSN: [id number] Arrival date & time: 12/12/23  1953     Patient presents with: Lisa Ward is a 78 y.o. female.   Pt is a 78 yo female with pmhx significant for htn, gerd, hld, and osteoporosis.  Pt said she fell on Monday (11/3) afternoon.  She was unable to get up and has been on the floor since then.  Her son was unable to get ahold of her, so he drove over and found her on the ground. She has pain all over.  She denies loc.         Prior to Admission medications   Medication Sig Start Date End Date Taking? Authorizing Provider  atorvastatin  (LIPITOR) 40 MG tablet Take 40 mg by mouth at bedtime.    [provider]  HYDROcodone -acetaminophen  (NORCO/VICODIN) 5-325 MG tablet Take 1-2 tablets by mouth every 6 (six) hours as needed for severe pain. 09/12/18   Patti Rosina SAUNDERS, PA-C  lisinopril-hydrochlorothiazide (ZESTORETIC) 20-25 MG tablet Take 1 tablet by mouth daily.    [provider]  methocarbamol  (ROBAXIN ) 500 MG tablet Take 1 tablet (500 mg total) by mouth every 6 (six) hours as needed for muscle spasms. 09/12/18   Patti Rosina SAUNDERS, PA-C  omeprazole (PRILOSEC) 40 MG capsule Take 40 mg by mouth daily.    [provider]  potassium gluconate 595 (99 K) MG TABS tablet Take 1,190 mg by mouth daily.    [provider]  traMADol  (ULTRAM ) 50 MG tablet Take 1-2 tablets (50-100 mg total) by mouth every 6 (six) hours as needed for moderate pain. 09/12/18   Patti Rosina SAUNDERS, PA-C    Allergies: Patient has no known allergies.    Review of Systems  Musculoskeletal:        Bilateral hip and knee pain; left hand pain; back pain  All other systems reviewed and are negative.   Updated Vital Signs BP 120/65   Pulse 78   Temp 98 F (36.7 C) (Oral)   Resp (!) 21   SpO2 98%   Physical Exam Vitals and nursing note reviewed.  Constitutional:      Appearance:  Normal appearance.  HENT:     Head: Normocephalic and atraumatic.     Right Ear: External ear normal.     Left Ear: External ear normal.     Nose: Nose normal.     Mouth/Throat:     Mouth: Mucous membranes are dry.  Eyes:     Extraocular Movements: Extraocular movements intact.     Conjunctiva/sclera: Conjunctivae normal.     Pupils: Pupils are equal, round, and reactive to light.  Cardiovascular:     Rate and Rhythm: Normal rate and regular rhythm.     Pulses: Normal pulses.     Heart sounds: Normal heart sounds.  Pulmonary:     Effort: Pulmonary effort is normal.     Breath sounds: Normal breath sounds.  Abdominal:     General: Abdomen is flat. Bowel sounds are normal.     Palpations: Abdomen is soft.  Musculoskeletal:     Cervical back: Normal range of motion and neck supple.     Comments: Pain to both hips and knees; back tenderness  Skin:    General: Skin is warm.     Capillary Refill: Capillary refill takes less than 2 seconds.     Comments: Sore to sacrum  Neurological:  General: No focal deficit present.     Mental Status: She is alert and oriented to person, place, and time.  Psychiatric:        Mood and Affect: Mood normal.        Behavior: Behavior normal.     (all labs ordered are listed, but only abnormal results are displayed) Labs Reviewed  CBC WITH DIFFERENTIAL/PLATELET - Abnormal; Notable for the following components:      Result Value   WBC 31.0 (*)    RBC 5.56 (*)    Hemoglobin 16.2 (*)    HCT 51.2 (*)    Neutro Abs 26.8 (*)    Monocytes Absolute 2.2 (*)    Abs Immature Granulocytes 0.35 (*)    All other components within normal limits  COMPREHENSIVE METABOLIC PANEL WITH GFR - Abnormal; Notable for the following components:   Glucose, Bld 135 (*)    BUN 29 (*)    Calcium  10.9 (*)    AST 63 (*)    All other components within normal limits  URINALYSIS, ROUTINE W REFLEX MICROSCOPIC - Abnormal; Notable for the following components:   Color,  Urine AMBER (*)    APPearance CLOUDY (*)    Ketones, ur 20 (*)    Protein, ur >=300 (*)    Leukocytes,Ua SMALL (*)    Bacteria, UA MANY (*)    All other components within normal limits  CK - Abnormal; Notable for the following components:   Total CK 898 (*)    All other components within normal limits  PROTIME-INR  TYPE AND SCREEN    EKG: EKG Interpretation Date/Time:  Wednesday December 12 2023 20:56:44 EST Ventricular Rate:  82 PR Interval:  151 QRS Duration:  119 QT Interval:  387 QTC Calculation: 455 R Axis:   -45  Text Interpretation: Sinus rhythm Right atrial enlargement Incomplete left bundle branch block Left ventricular hypertrophy Inferior infarct, old Artifact in lead(s) I II III aVR aVL aVF V1 V2 V3 V4 V5 V6 No significant change since last tracing Confirmed by Dean Clarity [phone]) on 12/12/2023 10:18:55 PM  Radiology: [facility] Hand Complete Left Result Date: 12/12/2023 CLINICAL DATA:  Pain, found down EXAM: LEFT HAND - COMPLETE 3+ VIEW COMPARISON:  None Available. FINDINGS: Frontal, oblique, and lateral views of the left hand are obtained. There are no acute displaced fractures. There is severe diffuse joint space narrowing throughout the hand and wrist compatible with osteoarthritis. Central erosions throughout the interphalangeal joints may reflect erosive osteoarthritis. Soft tissue swelling of the second and third digits. IMPRESSION: 1. No acute displaced fracture. 2. Severe multifocal osteoarthritis. Electronically Signed   By: Ozell Daring M.D.   On: 12/12/2023 22:26   CT CERVICAL SPINE WO CONTRAST Result Date: 12/12/2023 CLINICAL DATA:  Found down, pain, headache EXAM: CT CERVICAL SPINE WITHOUT CONTRAST TECHNIQUE: Multidetector CT imaging of the cervical spine was performed without intravenous contrast. Multiplanar CT image reconstructions were also generated. RADIATION DOSE REDUCTION: This exam was performed according to the departmental dose-optimization program  which includes automated exposure control, adjustment of the mA and/or kV according to patient size and/or use of iterative reconstruction technique. COMPARISON:  None Available. FINDINGS: Alignment: Mild degenerative anterolisthesis of C3 on C4. Otherwise alignment is anatomic. Skull base and vertebrae: No acute fracture. No primary bone lesion or focal pathologic process. Soft tissues and spinal canal: No prevertebral fluid or swelling. No visible canal hematoma. Disc levels: Severe spondylosis at C4-5, C5-6, and C6-7. Diffuse spondylosis greatest from C2-3 through  C4-5. Upper chest: Airway is patent. Dependent areas of consolidation are seen bilaterally at the lung apices, likely atelectasis. Central airways are patent. Other: Reconstructed images demonstrate no additional findings. IMPRESSION: 1. No acute cervical spine fracture. 2. Extensive multilevel cervical degenerative changes. Electronically Signed   By: Ozell Daring M.D.   On: 12/12/2023 22:01   CT HEAD WO CONTRAST Result Date: 12/12/2023 CLINICAL DATA:  Found down, headache EXAM: CT HEAD WITHOUT CONTRAST TECHNIQUE: Contiguous axial images were obtained from the base of the skull through the vertex without intravenous contrast. RADIATION DOSE REDUCTION: This exam was performed according to the departmental dose-optimization program which includes automated exposure control, adjustment of the mA and/or kV according to patient size and/or use of iterative reconstruction technique. COMPARISON:  07/22/2003 FINDINGS: Brain: No acute infarct or hemorrhage. Lateral ventricles and midline structures are unremarkable. No acute extra-axial fluid collections. No mass effect. Vascular: No hyperdense vessel or unexpected calcification. Skull: Scalp edema along the bilateral convexities. No underlying fractures. Remainder of the calvarium is unremarkable. Sinuses/Orbits: No acute finding. Other: None. IMPRESSION: 1. No acute intracranial process. 2. Scalp edema  along the bilateral convexities consistent with history of fall. Electronically Signed   By: Ozell Daring M.D.   On: 12/12/2023 21:59   DG Sacrum/Coccyx Result Date: 12/12/2023 CLINICAL DATA:  Fall EXAM: SACRUM AND COCCYX - 2+ VIEW COMPARISON:  09/11/2018 FINDINGS: Right hip replacement with intact hardware normal alignment. Non widened SI joints. Intact pubic symphysis. Limited due to technique and osteopenia. No definitive fracture IMPRESSION: Limited due to technique and osteopenia. No definitive fracture. Electronically Signed   By: Luke Bun M.D.   On: 12/12/2023 21:58   DG Chest 1 View Result Date: 12/12/2023 CLINICAL DATA:  Fall EXAM: CHEST  1 VIEW COMPARISON:  10/26/2005 FINDINGS: Cardiomegaly with aortic atherosclerosis. Perihilar interstitial opacities suggestive of airways thickening or inflammation. Negative for pleural effusion or pneumothorax. IMPRESSION: Cardiomegaly with perihilar interstitial opacities suggestive of airways thickening or inflammation. Electronically Signed   By: Luke Bun M.D.   On: 12/12/2023 21:58   DG Knee Complete 4 Views Right Result Date: 12/12/2023 CLINICAL DATA:  Fall EXAM: RIGHT KNEE - COMPLETE 4+ VIEW COMPARISON:  None Available. FINDINGS: Right knee replacement with intact hardware and normal alignment. No sizable effusion. Findings questionable for subtle nondisplaced fibular head fracture. IMPRESSION: Right knee replacement with intact hardware and normal alignment. Findings questionable for subtle nondisplaced fibular head fracture, correlate for point tenderness. Electronically Signed   By: Luke Bun M.D.   On: 12/12/2023 21:56   DG Knee Complete 4 Views Left Result Date: 12/12/2023 CLINICAL DATA:  Fall EXAM: LEFT KNEE - COMPLETE 4+ VIEW COMPARISON:  None Available. FINDINGS: No definitive fracture or malalignment. Severe tricompartment arthritis of the knee with probable trace effusion. Chondrocalcinosis. IMPRESSION: No acute osseous  abnormality. Severe tricompartment arthritis of the knee. Electronically Signed   By: Luke Bun M.D.   On: 12/12/2023 21:55   DG HIPS BILAT WITH PELVIS 3-4 VIEWS Result Date: 12/12/2023 CLINICAL DATA:  Found on floor EXAM: DG HIP (WITH OR WITHOUT PELVIS) 3-4V BILAT COMPARISON:  None Available. FINDINGS: SI joints are non widened. Pubic symphysis and rami appear intact. Right hip replacement with normal alignment. No definitive fracture. Moderate left hip degenerative change IMPRESSION: Right hip replacement with normal alignment. No definitive fracture. Electronically Signed   By: Luke Bun M.D.   On: 12/12/2023 21:54     Procedures   Medications Ordered in the ED  morphine  (  PF) 4 MG/ML injection 4 mg (4 mg Intravenous Given 12/12/23 2026)  cefTRIAXone (ROCEPHIN) 1 g in sodium chloride  0.9 % 100 mL IVPB (has no administration in time range)  ondansetron  (ZOFRAN ) injection 4 mg (4 mg Intravenous Given 12/12/23 2025)  sodium chloride  0.9 % bolus 1,000 mL (0 mLs Intravenous Stopped 12/12/23 2235)                                    Medical Decision Making Amount and/or Complexity of Data Reviewed Labs: ordered. Radiology: ordered.  Risk Prescription drug management. Decision regarding hospitalization.   This patient presents to the ED for concern of fall, this involves an extensive number of treatment options, and is a complaint that carries with it a high risk of complications and morbidity.  The differential diagnosis includes multiple trauma   Co morbidities that complicate the patient evaluation  htn, gerd, hld, and osteoporosis   Additional history obtained:  Additional history obtained from epic chart review External records from outside source obtained and reviewed including EMS report/son   Lab Tests:  I Ordered, and personally interpreted labs.  The pertinent results include:  cbc with wbc elevated at 31; cmp nl other than bun sl elevated at 29; ck elevated at  898; inr nl; UA + many bacteria, 21-50 bacteria, ketones/protein   Imaging Studies ordered:  I ordered imaging studies including ct head/ct c-spine/bilateral knee/hip/cxr/left hand  I independently visualized and interpreted imaging which showed  CT head: No acute intracranial process.  2. Scalp edema along the bilateral convexities consistent with  history of fall.  CT c-spine: No acute cervical spine fracture.  2. Extensive multilevel cervical degenerative changes.  Hips: Right hip replacement with normal alignment. No definitive fracture.  L knee: No acute osseous abnormality. Severe tricompartment arthritis of the  knee.  R knee: Right knee replacement with intact hardware and normal alignment.  Findings questionable for subtle nondisplaced fibular head fracture,  correlate for point tenderness.  CXR: Cardiomegaly with perihilar interstitial opacities suggestive of  airways thickening or inflammation.  Sacrum: Limited due to technique and osteopenia. No definitive fracture.  L hand: No acute displaced fracture.  2. Severe multifocal osteoarthritis.     I agree with the radiologist interpretation   Cardiac Monitoring:  The patient was maintained on a cardiac monitor.  I personally viewed and interpreted the cardiac monitored which showed an underlying rhythm of: nsr   Medicines ordered and prescription drug management:  I ordered medication including ivfs/morphine /zofran   for sx  Reevaluation of the patient after these medicines showed that the patient improved I have reviewed the patients home medicines and have made adjustments as needed   Test Considered:  ct   Critical Interventions:  ivfs   Consultations Obtained:  I requested consultation with the hospitalist (Dr. Tobie),  and discussed lab and imaging findings as well as pertinent plan - he will admit   Problem List / ED Course:  Fall:  no fx.  Pt unable to walk. Mild rhabdo:  no aki or  myoglobinuria. UTI:  rocephin given   Reevaluation:  After the interventions noted above, I reevaluated the patient and found that they have :improved   Social Determinants of Health:  Lives alone.  Son said they are in the process of moving pt in with him and his wife   Dispostion:  After consideration of the diagnostic results and the patients response  to treatment, I feel that the patent would benefit from admission.       Final diagnoses:  Traumatic rhabdomyolysis, initial encounter  Fall, initial encounter  Acute cystitis without hematuria    ED Discharge Orders     None          Dean Clarity, MD 12/12/23 2307

## 2023-12-12 NOTE — ED Triage Notes (Addendum)
 Pt arrives w/ [facility] after being found on the floor. Been on floor since Monday afternoon. Pain in rt lower abd & rt hip & back. Pain in bilateral knee. Pt also c/o headache. Arrives in c-collar. Pt covered in urine.  A&O x 4.  Hx dementia  VSS

## 2023-12-13 DIAGNOSIS — T796XXA Traumatic ischemia of muscle, initial encounter: Secondary | ICD-10-CM

## 2023-12-13 DIAGNOSIS — Z8249 Family history of ischemic heart disease and other diseases of the circulatory system: Secondary | ICD-10-CM | POA: Diagnosis not present

## 2023-12-13 DIAGNOSIS — Z6830 Body mass index (BMI) 30.0-30.9, adult: Secondary | ICD-10-CM | POA: Diagnosis not present

## 2023-12-13 DIAGNOSIS — Z833 Family history of diabetes mellitus: Secondary | ICD-10-CM | POA: Diagnosis not present

## 2023-12-13 DIAGNOSIS — Y92009 Unspecified place in unspecified non-institutional (private) residence as the place of occurrence of the external cause: Secondary | ICD-10-CM | POA: Diagnosis not present

## 2023-12-13 DIAGNOSIS — W19XXXA Unspecified fall, initial encounter: Secondary | ICD-10-CM | POA: Diagnosis present

## 2023-12-13 DIAGNOSIS — E66811 Obesity, class 1: Secondary | ICD-10-CM | POA: Diagnosis present

## 2023-12-13 DIAGNOSIS — S060XAA Concussion with loss of consciousness status unknown, initial encounter: Secondary | ICD-10-CM | POA: Diagnosis present

## 2023-12-13 DIAGNOSIS — J9601 Acute respiratory failure with hypoxia: Secondary | ICD-10-CM | POA: Diagnosis present

## 2023-12-13 DIAGNOSIS — Z801 Family history of malignant neoplasm of trachea, bronchus and lung: Secondary | ICD-10-CM | POA: Diagnosis not present

## 2023-12-13 DIAGNOSIS — E785 Hyperlipidemia, unspecified: Secondary | ICD-10-CM | POA: Diagnosis present

## 2023-12-13 DIAGNOSIS — Z751 Person awaiting admission to adequate facility elsewhere: Secondary | ICD-10-CM | POA: Diagnosis not present

## 2023-12-13 DIAGNOSIS — K5900 Constipation, unspecified: Secondary | ICD-10-CM | POA: Diagnosis present

## 2023-12-13 DIAGNOSIS — B9620 Unspecified Escherichia coli [E. coli] as the cause of diseases classified elsewhere: Secondary | ICD-10-CM | POA: Diagnosis present

## 2023-12-13 DIAGNOSIS — M6282 Rhabdomyolysis: Secondary | ICD-10-CM | POA: Diagnosis not present

## 2023-12-13 DIAGNOSIS — L89152 Pressure ulcer of sacral region, stage 2: Secondary | ICD-10-CM | POA: Diagnosis present

## 2023-12-13 DIAGNOSIS — Z90711 Acquired absence of uterus with remaining cervical stump: Secondary | ICD-10-CM | POA: Diagnosis not present

## 2023-12-13 DIAGNOSIS — I10 Essential (primary) hypertension: Secondary | ICD-10-CM | POA: Diagnosis not present

## 2023-12-13 DIAGNOSIS — E876 Hypokalemia: Secondary | ICD-10-CM | POA: Diagnosis present

## 2023-12-13 DIAGNOSIS — Z96651 Presence of right artificial knee joint: Secondary | ICD-10-CM | POA: Diagnosis present

## 2023-12-13 DIAGNOSIS — M810 Age-related osteoporosis without current pathological fracture: Secondary | ICD-10-CM | POA: Diagnosis present

## 2023-12-13 DIAGNOSIS — Z66 Do not resuscitate: Secondary | ICD-10-CM | POA: Diagnosis present

## 2023-12-13 DIAGNOSIS — N3000 Acute cystitis without hematuria: Secondary | ICD-10-CM | POA: Diagnosis present

## 2023-12-13 DIAGNOSIS — Z96641 Presence of right artificial hip joint: Secondary | ICD-10-CM | POA: Diagnosis present

## 2023-12-13 DIAGNOSIS — I119 Hypertensive heart disease without heart failure: Secondary | ICD-10-CM | POA: Diagnosis present

## 2023-12-13 DIAGNOSIS — E860 Dehydration: Secondary | ICD-10-CM | POA: Diagnosis present

## 2023-12-13 DIAGNOSIS — E861 Hypovolemia: Secondary | ICD-10-CM | POA: Diagnosis present

## 2023-12-13 LAB — COMPREHENSIVE METABOLIC PANEL WITH GFR
ALT: 25 U/L (ref 0–44)
AST: 56 U/L — ABNORMAL HIGH (ref 15–41)
Albumin: 3.4 g/dL — ABNORMAL LOW (ref 3.5–5.0)
Alkaline Phosphatase: 65 U/L (ref 38–126)
Anion gap: 11 (ref 5–15)
BUN: 31 mg/dL — ABNORMAL HIGH (ref 8–23)
CO2: 30 mmol/L (ref 22–32)
Calcium: 9.5 mg/dL (ref 8.9–10.3)
Chloride: 103 mmol/L (ref 98–111)
Creatinine, Ser: 0.47 mg/dL (ref 0.44–1.00)
GFR, Estimated: >60 mL/min (ref >60)
Glucose, Bld: 113 mg/dL — ABNORMAL HIGH (ref 70–99)
Potassium: 3.2 mmol/L — ABNORMAL LOW (ref 3.5–5.1)
Sodium: 143 mmol/L (ref 135–145)
Total Bilirubin: 0.9 mg/dL (ref 0.0–1.2)
Total Protein: 6.1 g/dL — ABNORMAL LOW (ref 6.5–8.1)

## 2023-12-13 LAB — CBC
HCT: 44.5 % (ref 36.0–46.0)
Hemoglobin: 14 g/dL (ref 12.0–15.0)
MCH: 29.4 pg (ref 26.0–34.0)
MCHC: 31.5 g/dL (ref 30.0–36.0)
MCV: 93.5 fL (ref 80.0–100.0)
Platelets: 280 K/uL (ref 150–400)
RBC: 4.76 MIL/uL (ref 3.87–5.11)
RDW: 15.2 % (ref 11.5–15.5)
WBC: 22.7 K/uL — ABNORMAL HIGH (ref 4.0–10.5)
nRBC: 0 % (ref 0.0–0.2)

## 2023-12-13 LAB — TYPE AND SCREEN
ABO/RH(D): A POS
Antibody Screen: NEGATIVE

## 2023-12-13 LAB — CK: Total CK: 957 U/L — ABNORMAL HIGH (ref 38–234)

## 2023-12-13 MED ORDER — SIMETHICONE 80 MG PO CHEW
80.0000 mg | CHEWABLE_TABLET | Freq: Four times a day (QID) | ORAL | Status: DC | PRN
Start: 2023-12-13 — End: 2023-12-19

## 2023-12-13 MED ORDER — ENSURE PLUS HIGH PROTEIN PO LIQD
237.0000 mL | Freq: Two times a day (BID) | ORAL | Status: DC
  Administered 2023-12-13 – 2023-12-18 (×9): 237 mL via ORAL

## 2023-12-13 MED ORDER — PANTOPRAZOLE SODIUM 40 MG PO TBEC
40.0000 mg | DELAYED_RELEASE_TABLET | Freq: Every day | ORAL | Status: DC
  Administered 2023-12-13 – 2023-12-19 (×7): 40 mg via ORAL
  Filled 2023-12-13 (×7): qty 1

## 2023-12-13 MED ORDER — SENNOSIDES-DOCUSATE SODIUM 8.6-50 MG PO TABS
1.0000 | ORAL_TABLET | Freq: Two times a day (BID) | ORAL | Status: DC
  Administered 2023-12-14 – 2023-12-19 (×10): 1 via ORAL
  Filled 2023-12-13 (×13): qty 1

## 2023-12-13 MED ORDER — TRAMADOL HCL 50 MG PO TABS
50.0000 mg | ORAL_TABLET | Freq: Four times a day (QID) | ORAL | Status: DC | PRN
  Administered 2023-12-13 – 2023-12-18 (×8): 100 mg via ORAL
  Filled 2023-12-13 (×9): qty 2

## 2023-12-13 MED ORDER — POTASSIUM CHLORIDE CRYS ER 20 MEQ PO TBCR
40.0000 meq | EXTENDED_RELEASE_TABLET | Freq: Once | ORAL | Status: AC
  Administered 2023-12-13: 40 meq via ORAL
  Filled 2023-12-13: qty 2

## 2023-12-13 MED ORDER — POLYETHYLENE GLYCOL 3350 17 G PO PACK
17.0000 g | PACK | Freq: Every day | ORAL | Status: DC
  Administered 2023-12-14 – 2023-12-19 (×5): 17 g via ORAL
  Filled 2023-12-13 (×6): qty 1

## 2023-12-13 MED ORDER — SODIUM CHLORIDE 0.9 % IV SOLN: INTRAVENOUS | Status: DC

## 2023-12-13 NOTE — Progress Notes (Signed)
 Pt I & O cath @2222  with 500 ml out. Pt has NS @ 125 infusing, has drunk multiple cups of water  and has not voided. Pt does not feel urge to void. Bladder scan shows . Encouraged pt to call as soon as she feels need to void.

## 2023-12-13 NOTE — Progress Notes (Signed)
 PROGRESS NOTE    Lisa Ward  FMW:[id number] DOB: 09-08-1945 DOA: 12/12/2023 PCP: Claudene Pellet, MD   Brief Narrative: 78 year old with past medical history significant for hypertension, hyperlipidemia, presented to the ED for evaluation of after a fall at home and prolonged time down.  Patient lives alone.  She was returning home after getting her down on Monday 11/3 she hit her head with her car when she was getting out, when she got into her house she was feeling dizzy and fell down.  She was down until Wednesday 11/fifth when her son went back to check on her.  Evaluation in the ED she was found to have rhabdomyolysis, right knee did note findings questionable for subtle nondisplaced fibular head fracture.   Assessment & Plan:   Principal Problem:   Rhabdomyolysis Active Problems:   Fall at home, initial encounter   Hypercalcemia   HTN (hypertension)   HLD (hyperlipidemia)   1-Fall at home, with prolonged downtime, elevated CK level Mild Rhabdomyolysis.  - Presented with a CK level of 898--- continue to increase to 900 - Continue with IV fluids - Monitor CK level  UTI: Reports dysuria, UA with 21-50 white blood cell Follow urine culture Continue IV ceftriaxone   Questionable nondisplaced fibular head fracture: -Will asked  Ortho to review, and give recommendation for weightbearing  Hypercalcemia: In the setting of dehydration: Continue with IV fluids  HTN: Continue to hold lisinopril and chlorothiazide in the setting of mild rhabdo  Hyperlipidemia: Continue to hold statins Mild transaminases  Leukocytosis: Could be in the setting of UTI or dehydration  Constipation: Start MiraLAX  Hypokalemia: Replace  Wound 12/12/23 2357 Pressure Injury Sacrum Left Stage 2 -  Partial thickness loss of dermis presenting as a shallow open injury with a red, pink wound bed without slough. (Active)     Estimated body mass index is 30.8 kg/m as calculated from the  following:   Height as of this encounter: 5' 4 (1.626 m).   Weight as of this encounter: 81.4 kg.   DVT prophylaxis: Lovenox Code Status: Full code Family Communication: Discussed with patient Disposition Plan:  Status is: Observation The patient remains OBS appropriate and will d/c before 2 midnights.    Consultants:    Procedures:    Antimicrobials:    Subjective: She reports some cramping abdominal pain, no bowel movement in the last 5 days.  She reports right knee pain  Objective: Vitals:   12/12/23 2315 12/12/23 2352 12/13/23 0543 12/13/23 0931  BP: (!) 123/53 135/66 (!) 121/55 (!) 149/68  Pulse: 78 85 77 79  Resp: (!) 22 18 16 18   Temp:  97.6 F (36.4 C) 98.8 F (37.1 C) 98.5 F (36.9 C)  TempSrc:  Oral Oral Oral  SpO2: 99% 96% 98% 99%  Weight:  81.4 kg    Height:  5' 4 (1.626 m)      Intake/Output Summary (Last 24 hours) at 12/13/2023 1102 Last data filed at 12/13/2023 1000 Gross per 24 hour  Intake 1256.64 ml  Output 500 ml  Net 756.64 ml   Filed Weights   12/12/23 2352  Weight: 81.4 kg    Examination:  General exam: Appears calm and comfortable  Respiratory system: Clear to auscultation. Respiratory effort normal. Cardiovascular system: S1 & S2 heard, RRR. No JVD, murmurs, rubs, gallops or clicks. No pedal edema. Gastrointestinal system: Abdomen is nondistended, soft and nontender. No organomegaly or masses felt. Normal bowel sounds heard. Central nervous system: Alert and oriented. No focal  neurological deficits. Extremities: Symmetric 5 x 5 power.     Data Reviewed: I have personally reviewed following labs and imaging studies  CBC: Recent Labs  Lab 12/12/23 2015 12/13/23 0507  WBC 31.0* 22.7*  NEUTROABS 26.8*  --   HGB 16.2* 14.0  HCT 51.2* 44.5  MCV 92.1 93.5  PLT 367 280   Basic Metabolic Panel: Recent Labs  Lab 12/12/23 2015 12/13/23 0507  NA 141 143  K 3.9 3.2*  CL 98 103  CO2 28 30  GLUCOSE 135* 113*  BUN 29*  31*  CREATININE 0.48 0.47  CALCIUM  10.9* 9.5   GFR: Estimated Creatinine Clearance: 57.9 mL/min (by C-G formula based on SCr of 0.47 mg/dL). Liver Function Tests: Recent Labs  Lab 12/12/23 2015 12/13/23 0507  AST 63* 56*  ALT 28 25  ALKPHOS 86 65  BILITOT 1.2 0.9  PROT 7.7 6.1*  ALBUMIN  4.2 3.4*   No results for input(s): LIPASE, AMYLASE in the last 168 hours. No results for input(s): AMMONIA in the last 168 hours. Coagulation Profile: Recent Labs  Lab 12/12/23 2015  INR 0.9   Cardiac Enzymes: Recent Labs  Lab 12/12/23 2015 12/13/23 0507  CKTOTAL 898* 957*   BNP (last 3 results) No results for input(s): PROBNP in the last 8760 hours. HbA1C: No results for input(s): HGBA1C in the last 72 hours. CBG: No results for input(s): GLUCAP in the last 168 hours. Lipid Profile: No results for input(s): CHOL, HDL, LDLCALC, TRIG, CHOLHDL, LDLDIRECT in the last 72 hours. Thyroid  Function Tests: No results for input(s): TSH, T4TOTAL, FREET4, T3FREE, THYROIDAB in the last 72 hours. Anemia Panel: No results for input(s): VITAMINB12, FOLATE, FERRITIN, TIBC, IRON, RETICCTPCT in the last 72 hours. Sepsis Labs: No results for input(s): PROCALCITON, LATICACIDVEN in the last 168 hours.  No results found for this or any previous visit (from the past 240 hours).       Radiology Studies: DG Hand Complete Left Result Date: 12/12/2023 CLINICAL DATA:  Pain, found down EXAM: LEFT HAND - COMPLETE 3+ VIEW COMPARISON:  None Available. FINDINGS: Frontal, oblique, and lateral views of the left hand are obtained. There are no acute displaced fractures. There is severe diffuse joint space narrowing throughout the hand and wrist compatible with osteoarthritis. Central erosions throughout the interphalangeal joints may reflect erosive osteoarthritis. Soft tissue swelling of the second and third digits. IMPRESSION: 1. No acute displaced fracture. 2.  Severe multifocal osteoarthritis. Electronically Signed   By: Ozell Daring M.D.   On: 12/12/2023 22:26   CT CERVICAL SPINE WO CONTRAST Result Date: 12/12/2023 CLINICAL DATA:  Found down, pain, headache EXAM: CT CERVICAL SPINE WITHOUT CONTRAST TECHNIQUE: Multidetector CT imaging of the cervical spine was performed without intravenous contrast. Multiplanar CT image reconstructions were also generated. RADIATION DOSE REDUCTION: This exam was performed according to the departmental dose-optimization program which includes automated exposure control, adjustment of the mA and/or kV according to patient size and/or use of iterative reconstruction technique. COMPARISON:  None Available. FINDINGS: Alignment: Mild degenerative anterolisthesis of C3 on C4. Otherwise alignment is anatomic. Skull base and vertebrae: No acute fracture. No primary bone lesion or focal pathologic process. Soft tissues and spinal canal: No prevertebral fluid or swelling. No visible canal hematoma. Disc levels: Severe spondylosis at C4-5, C5-6, and C6-7. Diffuse spondylosis greatest from C2-3 through C4-5. Upper chest: Airway is patent. Dependent areas of consolidation are seen bilaterally at the lung apices, likely atelectasis. Central airways are patent. Other: Reconstructed images demonstrate no additional  findings. IMPRESSION: 1. No acute cervical spine fracture. 2. Extensive multilevel cervical degenerative changes. Electronically Signed   By: Ozell Daring M.D.   On: 12/12/2023 22:01   CT HEAD WO CONTRAST Result Date: 12/12/2023 CLINICAL DATA:  Found down, headache EXAM: CT HEAD WITHOUT CONTRAST TECHNIQUE: Contiguous axial images were obtained from the base of the skull through the vertex without intravenous contrast. RADIATION DOSE REDUCTION: This exam was performed according to the departmental dose-optimization program which includes automated exposure control, adjustment of the mA and/or kV according to patient size and/or use of  iterative reconstruction technique. COMPARISON:  07/22/2003 FINDINGS: Brain: No acute infarct or hemorrhage. Lateral ventricles and midline structures are unremarkable. No acute extra-axial fluid collections. No mass effect. Vascular: No hyperdense vessel or unexpected calcification. Skull: Scalp edema along the bilateral convexities. No underlying fractures. Remainder of the calvarium is unremarkable. Sinuses/Orbits: No acute finding. Other: None. IMPRESSION: 1. No acute intracranial process. 2. Scalp edema along the bilateral convexities consistent with history of fall. Electronically Signed   By: Ozell Daring M.D.   On: 12/12/2023 21:59   DG Sacrum/Coccyx Result Date: 12/12/2023 CLINICAL DATA:  Fall EXAM: SACRUM AND COCCYX - 2+ VIEW COMPARISON:  09/11/2018 FINDINGS: Right hip replacement with intact hardware normal alignment. Non widened SI joints. Intact pubic symphysis. Limited due to technique and osteopenia. No definitive fracture IMPRESSION: Limited due to technique and osteopenia. No definitive fracture. Electronically Signed   By: Luke Bun M.D.   On: 12/12/2023 21:58   DG Chest 1 View Result Date: 12/12/2023 CLINICAL DATA:  Fall EXAM: CHEST  1 VIEW COMPARISON:  10/26/2005 FINDINGS: Cardiomegaly with aortic atherosclerosis. Perihilar interstitial opacities suggestive of airways thickening or inflammation. Negative for pleural effusion or pneumothorax. IMPRESSION: Cardiomegaly with perihilar interstitial opacities suggestive of airways thickening or inflammation. Electronically Signed   By: Luke Bun M.D.   On: 12/12/2023 21:58   DG Knee Complete 4 Views Right Result Date: 12/12/2023 CLINICAL DATA:  Fall EXAM: RIGHT KNEE - COMPLETE 4+ VIEW COMPARISON:  None Available. FINDINGS: Right knee replacement with intact hardware and normal alignment. No sizable effusion. Findings questionable for subtle nondisplaced fibular head fracture. IMPRESSION: Right knee replacement with intact hardware  and normal alignment. Findings questionable for subtle nondisplaced fibular head fracture, correlate for point tenderness. Electronically Signed   By: Luke Bun M.D.   On: 12/12/2023 21:56   DG Knee Complete 4 Views Left Result Date: 12/12/2023 CLINICAL DATA:  Fall EXAM: LEFT KNEE - COMPLETE 4+ VIEW COMPARISON:  None Available. FINDINGS: No definitive fracture or malalignment. Severe tricompartment arthritis of the knee with probable trace effusion. Chondrocalcinosis. IMPRESSION: No acute osseous abnormality. Severe tricompartment arthritis of the knee. Electronically Signed   By: Luke Bun M.D.   On: 12/12/2023 21:55   DG HIPS BILAT WITH PELVIS 3-4 VIEWS Result Date: 12/12/2023 CLINICAL DATA:  Found on floor EXAM: DG HIP (WITH OR WITHOUT PELVIS) 3-4V BILAT COMPARISON:  None Available. FINDINGS: SI joints are non widened. Pubic symphysis and rami appear intact. Right hip replacement with normal alignment. No definitive fracture. Moderate left hip degenerative change IMPRESSION: Right hip replacement with normal alignment. No definitive fracture. Electronically Signed   By: Luke Bun M.D.   On: 12/12/2023 21:54        Scheduled Meds:  enoxaparin (LOVENOX) injection  40 mg Subcutaneous Q24H   feeding supplement  237 mL Oral BID BM   Continuous Infusions:  sodium chloride  125 mL/hr at 12/13/23 1053   cefTRIAXone (  ROCEPHIN)  IV       LOS: 0 days    Time spent: 35 Minutes.     Owen DELENA Lore, MD [facility]   If 7PM-7AM, please contact night-coverage [email or web address]  12/13/2023, 11:02 AM

## 2023-12-13 NOTE — Evaluation (Signed)
 Physical Therapy Evaluation Patient Details Name: Lisa Ward MRN: [id number] DOB: 16-Dec-1945 Today's Date: 12/13/2023  History of Present Illness  78-year-old presented to the ED 12/12/23 for evaluation of after a fall at home and prolonged time down After hitting her head on car door , feeling dizzy , question of right fibular hear fracture.PMH::hypertension, hyperlipidemia,   vertigo(per pt), RTHA, RTKA  Clinical Impression  Pt admitted with above diagnosis.  Pt currently with functional limitations due to the deficits listed below (see PT Problem List). Pt will benefit from acute skilled PT to increase their independence and safety with mobility to allow discharge.     The  patient   reports that she was dizzy after hitting head on car, then falling in her home. Patient is reporting dizziness with mobility this visit. Patient reports H/O  vertigo.  Patient did dangle on bed edge, requiring max/total assistance, reporting  bilateral leg pain.  Clarification of WB on R LE is pending per ortho consult.  Patient will benefit from continued inpatient follow up therapy, <3 hours/day.  Patient recently moved in with son, has been independent, using rollator and driving.      If plan is discharge home, recommend the following: Two people to help with walking and/or transfers;A lot of help with bathing/dressing/bathroom;Assist for transportation;Help with stairs or ramp for entrance   Can travel by private vehicle   No    Equipment Recommendations None recommended by PT  Recommendations for Other Services       Functional Status Assessment Patient has had a recent decline in their functional status and demonstrates the ability to make significant improvements in function in a reasonable and predictable amount of time.     Precautions / Restrictions Precautions Precautions: Fall Precaution/Restrictions Comments: painful knees, pressure injuries on buttocks Restrictions Other  Position/Activity Restrictions: ortho C/S pending for WBS on R LE for ? fibular head fx      Mobility  Bed Mobility Overal bed mobility: Needs Assistance Bed Mobility: Rolling, Sidelying to Sit, Sit to Supine Rolling: Max assist, +2 for physical assistance, +2 for safety/equipment, Used rails Sidelying to sit: Max assist, +2 for physical assistance, +2 for safety/equipment   Sit to supine: Total assist, +2 for physical assistance, +2 for safety/equipment   General bed mobility comments: patient reporting pain in a lot of places while rolling. Max to toal assistance to move to sitting, maxislide  placed under bed pad to facilitate rotation on bed in sitting and back to supine. Total assistance to lift legs and  assist trunk back to supine. Reports dizzines to roll, to sit up and return to supine    Transfers                   General transfer comment: unable to stand    Ambulation/Gait                  Stairs            Wheelchair Mobility     Tilt Bed    Modified Rankin (Stroke Patients Only)       Balance Overall balance assessment: Needs assistance, History of Falls Sitting-balance support: Bilateral upper extremity supported, Feet unsupported Sitting balance-Leahy Scale: Poor Sitting balance - Comments: intermittent support for balance, Postural control: Posterior lean  Pertinent Vitals/Pain Pain Assessment Pain Assessment: Faces Faces Pain Scale: Hurts whole lot Pain Location: back both knees, lower right leg to palpate and  at right fib head  region Pain Descriptors / Indicators: Discomfort, Grimacing, Pressure Pain Intervention(s): Limited activity within patient's tolerance, Monitored during session, Repositioned    Home Living Family/patient expects to be discharged to:: Private residence   Available Help at Discharge: Family Type of Home: Mobile home Home Access: Ramped entrance        Home Layout: One level Home Equipment: Cane - single point;Rollator (4 wheels) Additional Comments: lives with son now    Prior Function Prior Level of Function : Independent/Modified Independent;Driving             Mobility Comments: drives from [place] area back to [place] to check on her house, get the mail and a haircut., uses a rollator ADLs Comments: I ADLs     Extremity/Trunk Assessment   Upper Extremity Assessment Upper Extremity Assessment: Defer to OT evaluation    Lower Extremity Assessment Lower Extremity Assessment: RLE deficits/detail;LLE deficits/detail RLE Deficits / Details: barely lifts leg from bed, in sitting able to extend to   knee through 60% range, decreased knee flexion tolerated  sitting- to about 30* RLE: Unable to fully assess due to pain LLE Deficits / Details: about the same as right LLE: Unable to fully assess due to pain    Cervical / Trunk Assessment Cervical / Trunk Assessment: Normal  Communication   Communication Communication: No apparent difficulties    Cognition Arousal: Alert Behavior During Therapy: WFL for tasks assessed/performed   PT - Cognitive impairments: No apparent impairments                       PT - Cognition Comments: patient verbose in describing home situationa nd events leading to being in hospital Following commands: Intact       Cueing Cueing Techniques: Verbal cues     General Comments      Exercises     Assessment/Plan    PT Assessment Patient needs continued PT services  PT Problem List         PT Treatment Interventions DME instruction;Gait training;Cognitive remediation;Functional mobility training;Therapeutic activities;Therapeutic exercise;Balance training;Patient/family education    PT Goals (Current goals can be found in the Care Plan section)  Acute Rehab PT Goals Patient Stated Goal: to walk PT Goal Formulation: With patient Time For Goal Achievement: 12/27/23 Potential  to Achieve Goals: Fair    Frequency Min 2X/week     Co-evaluation               AM-PAC PT 6 Clicks Mobility  Outcome Measure Help needed turning from your back to your side while in a flat bed without using bedrails?: Total Help needed moving from lying on your back to sitting on the side of a flat bed without using bedrails?: Total Help needed moving to and from a bed to a chair (including a wheelchair)?: Total Help needed standing up from a chair using your arms (e.g., wheelchair or bedside chair)?: Total Help needed to walk in hospital room?: Total Help needed climbing 3-5 steps with a railing? : Total 6 Click Score: 6    End of Session   Activity Tolerance: Patient limited by pain Patient left: in bed;with call bell/phone within reach;with bed alarm set Nurse Communication: Mobility status;Need for lift equipment PT Visit Diagnosis: Unsteadiness on feet (R26.81);History of falling (Z91.81);Muscle weakness (generalized) (M62.81);Pain;Dizziness and giddiness (  R42) Pain - Right/Left: Right    Time: 1019-1050 PT Time Calculation (min) (ACUTE ONLY): 31 min   Charges:   PT Evaluation $PT Eval Low Complexity: 1 Low PT Treatments $Therapeutic Activity: 8-22 mins PT General Charges $$ ACUTE PT VISIT: 1 Visit         Darice Potters PT Acute Rehabilitation Services Office [phone]   Potters Darice Norris 12/13/2023, 1:01 PM

## 2023-12-14 ENCOUNTER — Inpatient Hospital Stay (HOSPITAL_COMMUNITY)

## 2023-12-14 DIAGNOSIS — Z96651 Presence of right artificial knee joint: Secondary | ICD-10-CM | POA: Diagnosis not present

## 2023-12-14 DIAGNOSIS — T796XXA Traumatic ischemia of muscle, initial encounter: Secondary | ICD-10-CM | POA: Diagnosis not present

## 2023-12-14 DIAGNOSIS — S82141A Displaced bicondylar fracture of right tibia, initial encounter for closed fracture: Secondary | ICD-10-CM | POA: Diagnosis not present

## 2023-12-14 LAB — CBC
HCT: 39.4 % (ref 36.0–46.0)
Hemoglobin: 12.3 g/dL (ref 12.0–15.0)
MCH: 29.8 pg (ref 26.0–34.0)
MCHC: 31.2 g/dL (ref 30.0–36.0)
MCV: 95.4 fL (ref 80.0–100.0)
Platelets: 223 K/uL (ref 150–400)
RBC: 4.13 MIL/uL (ref 3.87–5.11)
RDW: 15.1 % (ref 11.5–15.5)
WBC: 12.7 K/uL — ABNORMAL HIGH (ref 4.0–10.5)
nRBC: 0 % (ref 0.0–0.2)

## 2023-12-14 LAB — COMPREHENSIVE METABOLIC PANEL WITH GFR
ALT: 24 U/L (ref 0–44)
AST: 45 U/L — ABNORMAL HIGH (ref 15–41)
Albumin: 3.1 g/dL — ABNORMAL LOW (ref 3.5–5.0)
Alkaline Phosphatase: 56 U/L (ref 38–126)
Anion gap: 7 (ref 5–15)
BUN: 25 mg/dL — ABNORMAL HIGH (ref 8–23)
CO2: 27 mmol/L (ref 22–32)
Calcium: 9 mg/dL (ref 8.9–10.3)
Chloride: 106 mmol/L (ref 98–111)
Creatinine, Ser: 0.41 mg/dL — ABNORMAL LOW (ref 0.44–1.00)
GFR, Estimated: >60 mL/min (ref >60)
Glucose, Bld: 93 mg/dL (ref 70–99)
Potassium: 3.3 mmol/L — ABNORMAL LOW (ref 3.5–5.1)
Sodium: 140 mmol/L (ref 135–145)
Total Bilirubin: 0.7 mg/dL (ref 0.0–1.2)
Total Protein: 5.6 g/dL — ABNORMAL LOW (ref 6.5–8.1)

## 2023-12-14 LAB — CK: Total CK: 440 U/L — ABNORMAL HIGH (ref 38–234)

## 2023-12-14 MED ORDER — POTASSIUM CHLORIDE CRYS ER 20 MEQ PO TBCR
40.0000 meq | EXTENDED_RELEASE_TABLET | Freq: Once | ORAL | Status: AC
  Administered 2023-12-14: 40 meq via ORAL
  Filled 2023-12-14: qty 2

## 2023-12-14 MED ORDER — DICLOFENAC SODIUM 1 % EX GEL
2.0000 g | Freq: Four times a day (QID) | CUTANEOUS | Status: DC
  Administered 2023-12-14 – 2023-12-19 (×21): 2 g via TOPICAL
  Filled 2023-12-14: qty 100

## 2023-12-14 MED ORDER — MORPHINE SULFATE (PF) 2 MG/ML IV SOLN
1.0000 mg | INTRAVENOUS | Status: DC | PRN
  Administered 2023-12-15 – 2023-12-18 (×2): 1 mg via INTRAVENOUS
  Filled 2023-12-14 (×2): qty 1

## 2023-12-14 MED ORDER — FLUTICASONE PROPIONATE 50 MCG/ACT NA SUSP
1.0000 | Freq: Every day | NASAL | Status: DC
  Administered 2023-12-14 – 2023-12-19 (×6): 1 via NASAL
  Filled 2023-12-14: qty 16

## 2023-12-14 MED ORDER — IPRATROPIUM-ALBUTEROL 0.5-2.5 (3) MG/3ML IN SOLN
3.0000 mL | Freq: Two times a day (BID) | RESPIRATORY_TRACT | Status: DC
  Administered 2023-12-14 – 2023-12-15 (×3): 3 mL via RESPIRATORY_TRACT
  Filled 2023-12-14 (×4): qty 3

## 2023-12-14 NOTE — TOC Initial Note (Addendum)
 Transition of Care [facility]) - Initial/Assessment Note    Patient Details  Name: Lisa Ward MRN: [id number] Date of Birth: 09/13/1945  [facility]) CM/SW Contact:    Alfonse JONELLE Rex, RN Phone Number: 12/14/2023, 2:04 PM  Clinical Narrative:  Met with patient at bedside to introduce role of TOC/NCM and review for dc planning, PT recommendation for short term rehab/SNF, patient reported pain during visit, nurse at bedside administering pain medications, patient declined SNF. NCM called to patient's son, Zell Saba, introduced self and reason for call. Zell states patient resides with him and his spouse in the [place] , [place] area,  agreeable to short term rehab, request NCM to send referral to [facility] and Rehab in [place],. [place]. NCM called to Ronal Reap at [phone], transferred to Admitting Department, no answer, vm left with NCM name and phone number requesting call back. NCM will fax out for bed offers in [place] area as well, pt's son agreeable. Bill states patient was ambulatory with a RW prior to admission, also has a wheelchair at home.        -4:00pm Unable to pull patient up in NCMust, called to NCMust, provided SSN ( NCM verified SSN with patient), representative states SSN does not pull up patient , will need copy of patient's SSN faxed over to [facility] at [phone]. NCM called to patient's son, states she will try to locate patient's Social Security Card and sent picture to NCM.              Expected Discharge Plan: Skilled Nursing Facility Barriers to Discharge: Continued Medical Work up   Patient Goals and CMS Choice Patient states their goals for this hospitalization and ongoing recovery are:: return home          Expected Discharge Plan and Services                                              Prior Living Arrangements/Services   Lives with:: Other (Comment) (patient states she does not live alone) Patient language and  need for interpreter reviewed:: Yes Do you feel safe going back to the place where you live?: Yes      Need for Family Participation in Patient Care: Yes (Comment) Care giver support system in place?: Yes (comment)   Criminal Activity/Legal Involvement Pertinent to Current Situation/Hospitalization: No - Comment as needed  Activities of Daily Living   ADL Screening (condition at time of admission) Independently performs ADLs?: Yes (appropriate for developmental age) Is the patient deaf or have difficulty hearing?: Yes Does the patient have difficulty seeing, even when wearing glasses/contacts?: Yes (doesn't wear them) Does the patient have difficulty concentrating, remembering, or making decisions?: Yes  Permission Sought/Granted                  Emotional Assessment Appearance:: Appears stated age Attitude/Demeanor/Rapport: Inconsistent Affect (typically observed): Unable to Assess Orientation: : Oriented to Self, Oriented to Place, Oriented to  Time, Oriented to Situation Alcohol / Substance Use: Not Applicable Psych Involvement: No (comment)  Admission diagnosis:  Rhabdomyolysis [M62.82] Acute cystitis without hematuria [N30.00] Fall, initial encounter [W19.XXXA] Traumatic rhabdomyolysis, initial encounter [T79.6XXA] Patient Active Problem List   Diagnosis Date Noted   Rhabdomyolysis 12/12/2023   Fall at home, initial encounter 12/12/2023   Hypercalcemia 12/12/2023   HTN (hypertension)  HLD (hyperlipidemia)    OA (osteoarthritis) of hip 09/11/2018   PCP:  Claudene Pellet, MD Pharmacy:   [id number] [address], [facility] - [phone] [place] [address] Phone: [phone] Fax: [phone]     Social Drivers of Health (SDOH) Social History: SDOH Screenings   Food Insecurity: No Food Insecurity (12/13/2023)  Housing: Low Risk  (12/13/2023)  Transportation Needs: No Transportation Needs (12/13/2023)  Utilities: Not At Risk (12/13/2023)   Social Connections: Socially Isolated (12/13/2023)  Tobacco Use: Low Risk  (12/12/2023)   SDOH Interventions:     Readmission Risk Interventions    12/14/2023    1:44 PM  Readmission Risk Prevention Plan  Post Dischage Appt Complete  Medication Screening Complete  Transportation Screening Complete

## 2023-12-14 NOTE — Plan of Care (Signed)
 ?  Problem: Clinical Measurements: ?Goal: Ability to maintain clinical measurements within normal limits will improve ?Outcome: Progressing ?Goal: Will remain free from infection ?Outcome: Progressing ?Goal: Diagnostic test results will improve ?Outcome: Progressing ?  ?
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
## 2023-12-14 NOTE — Progress Notes (Addendum)
 PROGRESS NOTE    Lisa Ward  FMW:[id number] DOB: November 28, 1945 DOA: 12/12/2023 PCP: Claudene Pellet, MD   Brief Narrative: 78 year old with past medical history significant for hypertension, hyperlipidemia, presented to the ED for evaluation of after a fall at home and prolonged time down.  Patient lives alone.  She was returning home after getting her down on Monday 11/3 she hit her head with her car when she was getting out, when she got into her house she was feeling dizzy and fell down.  She was down until Wednesday 11/fifth when her son went back to check on her.  Evaluation in the ED she was found to have rhabdomyolysis, right knee did note findings questionable for subtle nondisplaced fibular head fracture.   Assessment & Plan:   Principal Problem:   Rhabdomyolysis Active Problems:   Fall at home, initial encounter   Hypercalcemia   HTN (hypertension)   HLD (hyperlipidemia)   1-Fall at home, with prolonged downtime, elevated CK level Mild Rhabdomyolysis.  - Presented with a CK level of 898--- 900--440 -Received IV fluids. NSL fluids today, CK down to 400  Acute Hypoxic Respiratory failure:  -Oxygen: on presentation 88 % on RA. Placed on 3 L oxygen.  -Plan to wean oxygen as possible. Report many years of second hand exposure.  -Plan for Duo-neb BID. Incentive spirometry.  -Chest ray: Cardiomegaly with perihilar interstitial opacities suggestive of airways thickening or inflammation. -Already on IV antibiotics for UTI>   UTI: Reports dysuria, UA with 21-50 white blood cell Follow urine culture: 100 K Gram negative rods.  Continue IV ceftriaxone  Concussion:  Ct head: No acute intracranial process. Scalp edema along the bilateral convexities consistent with history of fall. Had some headaches.    Questionable nondisplaced fibular head fracture: -Ortho consulted. [facility] Dr Barton  -Recommend CT right knee -Follow Ortho recommendation for weight  bearing Discussed with Ortho, CT negative for fracture, out patient follow up /  Hypercalcemia: In the setting of dehydration: Treated  with IV fluids  HTN: Continue to hold lisinopril and chlorothiazide in the setting of mild rhabdo Resume if BP increases .  Hyperlipidemia: Continue to hold statins Mild transaminases  Leukocytosis: Could be in the setting of UTI or dehydration  Constipation: Continue with  MiraLAX  Hypokalemia: Replace  Wound 12/12/23 2357 Pressure Injury Sacrum Left Stage 2 -  Partial thickness loss of dermis presenting as a shallow open injury with a red, pink wound bed without slough. (Active)     Estimated body mass index is 30.8 kg/m as calculated from the following:   Height as of this encounter: 5' 4 (1.626 m).   Weight as of this encounter: 81.4 kg.   DVT prophylaxis: Lovenox Code Status: Full code Family Communication: Discussed with patient, son who was at bedside.  Disposition Plan:  Status is: Observation The patient remains OBS appropriate and will d/c before 2 midnights.    Consultants:  Ortho  Procedures:    Antimicrobials:    Subjective: She report pain right knee is better.  Having more pain left knee  Objective: Vitals:   12/13/23 0931 12/13/23 1350 12/13/23 2120 12/14/23 0540  BP: (!) 149/68 (!) 147/63 137/61 129/63  Pulse: 79 70 75 75  Resp: 18 18 16 16   Temp: 98.5 F (36.9 C) 98.5 F (36.9 C) 98.2 F (36.8 C) 98.2 F (36.8 C)  TempSrc: Oral Oral Oral Oral  SpO2: 99% 100% 97% 94%  Weight:      Height:  Intake/Output Summary (Last 24 hours) at 12/14/2023 0844 Last data filed at 12/14/2023 0600 Gross per 24 hour  Intake 3059.7 ml  Output 1000 ml  Net 2059.7 ml   Filed Weights   12/12/23 2352  Weight: 81.4 kg    Examination:  General exam: NAD Respiratory system: CTA Cardiovascular system: S 1, S 2 RRR Gastrointestinal system: BS present, soft, nt Central nervous system: alert, conversant ,  follows command     Data Reviewed: I have personally reviewed following labs and imaging studies  CBC: Recent Labs  Lab 12/12/23 2015 12/13/23 0507 12/14/23 0447  WBC 31.0* 22.7* 12.7*  NEUTROABS 26.8*  --   --   HGB 16.2* 14.0 12.3  HCT 51.2* 44.5 39.4  MCV 92.1 93.5 95.4  PLT 367 280 223   Basic Metabolic Panel: Recent Labs  Lab 12/12/23 2015 12/13/23 0507 12/14/23 0447  NA 141 143 140  K 3.9 3.2* 3.3*  CL 98 103 106  CO2 28 30 27   GLUCOSE 135* 113* 93  BUN 29* 31* 25*  CREATININE 0.48 0.47 0.41*  CALCIUM  10.9* 9.5 9.0   GFR: Estimated Creatinine Clearance: 57.9 mL/min (A) (by C-G formula based on SCr of 0.41 mg/dL (L)). Liver Function Tests: Recent Labs  Lab 12/12/23 2015 12/13/23 0507 12/14/23 0447  AST 63* 56* 45*  ALT 28 25 24   ALKPHOS 86 65 56  BILITOT 1.2 0.9 0.7  PROT 7.7 6.1* 5.6*  ALBUMIN  4.2 3.4* 3.1*   No results for input(s): LIPASE, AMYLASE in the last 168 hours. No results for input(s): AMMONIA in the last 168 hours. Coagulation Profile: Recent Labs  Lab 12/12/23 2015  INR 0.9   Cardiac Enzymes: Recent Labs  Lab 12/12/23 2015 12/13/23 0507 12/14/23 0447  CKTOTAL 898* 957* 440*   BNP (last 3 results) No results for input(s): PROBNP in the last 8760 hours. HbA1C: No results for input(s): HGBA1C in the last 72 hours. CBG: No results for input(s): GLUCAP in the last 168 hours. Lipid Profile: No results for input(s): CHOL, HDL, LDLCALC, TRIG, CHOLHDL, LDLDIRECT in the last 72 hours. Thyroid  Function Tests: No results for input(s): TSH, T4TOTAL, FREET4, T3FREE, THYROIDAB in the last 72 hours. Anemia Panel: No results for input(s): VITAMINB12, FOLATE, FERRITIN, TIBC, IRON, RETICCTPCT in the last 72 hours. Sepsis Labs: No results for input(s): PROCALCITON, LATICACIDVEN in the last 168 hours.  No results found for this or any previous visit (from the past 240 hours).        Radiology Studies: DG Hand Complete Left Result Date: 12/12/2023 CLINICAL DATA:  Pain, found down EXAM: LEFT HAND - COMPLETE 3+ VIEW COMPARISON:  None Available. FINDINGS: Frontal, oblique, and lateral views of the left hand are obtained. There are no acute displaced fractures. There is severe diffuse joint space narrowing throughout the hand and wrist compatible with osteoarthritis. Central erosions throughout the interphalangeal joints may reflect erosive osteoarthritis. Soft tissue swelling of the second and third digits. IMPRESSION: 1. No acute displaced fracture. 2. Severe multifocal osteoarthritis. Electronically Signed   By: Ozell Daring M.D.   On: 12/12/2023 22:26   CT CERVICAL SPINE WO CONTRAST Result Date: 12/12/2023 CLINICAL DATA:  Found down, pain, headache EXAM: CT CERVICAL SPINE WITHOUT CONTRAST TECHNIQUE: Multidetector CT imaging of the cervical spine was performed without intravenous contrast. Multiplanar CT image reconstructions were also generated. RADIATION DOSE REDUCTION: This exam was performed according to the departmental dose-optimization program which includes automated exposure control, adjustment of the mA and/or kV according  to patient size and/or use of iterative reconstruction technique. COMPARISON:  None Available. FINDINGS: Alignment: Mild degenerative anterolisthesis of C3 on C4. Otherwise alignment is anatomic. Skull base and vertebrae: No acute fracture. No primary bone lesion or focal pathologic process. Soft tissues and spinal canal: No prevertebral fluid or swelling. No visible canal hematoma. Disc levels: Severe spondylosis at C4-5, C5-6, and C6-7. Diffuse spondylosis greatest from C2-3 through C4-5. Upper chest: Airway is patent. Dependent areas of consolidation are seen bilaterally at the lung apices, likely atelectasis. Central airways are patent. Other: Reconstructed images demonstrate no additional findings. IMPRESSION: 1. No acute cervical spine  fracture. 2. Extensive multilevel cervical degenerative changes. Electronically Signed   By: Ozell Daring M.D.   On: 12/12/2023 22:01   CT HEAD WO CONTRAST Result Date: 12/12/2023 CLINICAL DATA:  Found down, headache EXAM: CT HEAD WITHOUT CONTRAST TECHNIQUE: Contiguous axial images were obtained from the base of the skull through the vertex without intravenous contrast. RADIATION DOSE REDUCTION: This exam was performed according to the departmental dose-optimization program which includes automated exposure control, adjustment of the mA and/or kV according to patient size and/or use of iterative reconstruction technique. COMPARISON:  07/22/2003 FINDINGS: Brain: No acute infarct or hemorrhage. Lateral ventricles and midline structures are unremarkable. No acute extra-axial fluid collections. No mass effect. Vascular: No hyperdense vessel or unexpected calcification. Skull: Scalp edema along the bilateral convexities. No underlying fractures. Remainder of the calvarium is unremarkable. Sinuses/Orbits: No acute finding. Other: None. IMPRESSION: 1. No acute intracranial process. 2. Scalp edema along the bilateral convexities consistent with history of fall. Electronically Signed   By: Ozell Daring M.D.   On: 12/12/2023 21:59   DG Sacrum/Coccyx Result Date: 12/12/2023 CLINICAL DATA:  Fall EXAM: SACRUM AND COCCYX - 2+ VIEW COMPARISON:  09/11/2018 FINDINGS: Right hip replacement with intact hardware normal alignment. Non widened SI joints. Intact pubic symphysis. Limited due to technique and osteopenia. No definitive fracture IMPRESSION: Limited due to technique and osteopenia. No definitive fracture. Electronically Signed   By: Luke Bun M.D.   On: 12/12/2023 21:58   DG Chest 1 View Result Date: 12/12/2023 CLINICAL DATA:  Fall EXAM: CHEST  1 VIEW COMPARISON:  10/26/2005 FINDINGS: Cardiomegaly with aortic atherosclerosis. Perihilar interstitial opacities suggestive of airways thickening or inflammation.  Negative for pleural effusion or pneumothorax. IMPRESSION: Cardiomegaly with perihilar interstitial opacities suggestive of airways thickening or inflammation. Electronically Signed   By: Luke Bun M.D.   On: 12/12/2023 21:58   DG Knee Complete 4 Views Right Result Date: 12/12/2023 CLINICAL DATA:  Fall EXAM: RIGHT KNEE - COMPLETE 4+ VIEW COMPARISON:  None Available. FINDINGS: Right knee replacement with intact hardware and normal alignment. No sizable effusion. Findings questionable for subtle nondisplaced fibular head fracture. IMPRESSION: Right knee replacement with intact hardware and normal alignment. Findings questionable for subtle nondisplaced fibular head fracture, correlate for point tenderness. Electronically Signed   By: Luke Bun M.D.   On: 12/12/2023 21:56   DG Knee Complete 4 Views Left Result Date: 12/12/2023 CLINICAL DATA:  Fall EXAM: LEFT KNEE - COMPLETE 4+ VIEW COMPARISON:  None Available. FINDINGS: No definitive fracture or malalignment. Severe tricompartment arthritis of the knee with probable trace effusion. Chondrocalcinosis. IMPRESSION: No acute osseous abnormality. Severe tricompartment arthritis of the knee. Electronically Signed   By: Luke Bun M.D.   On: 12/12/2023 21:55   DG HIPS BILAT WITH PELVIS 3-4 VIEWS Result Date: 12/12/2023 CLINICAL DATA:  Found on floor EXAM: DG HIP (WITH OR WITHOUT PELVIS)  3-4V BILAT COMPARISON:  None Available. FINDINGS: SI joints are non widened. Pubic symphysis and rami appear intact. Right hip replacement with normal alignment. No definitive fracture. Moderate left hip degenerative change IMPRESSION: Right hip replacement with normal alignment. No definitive fracture. Electronically Signed   By: Luke Bun M.D.   On: 12/12/2023 21:54        Scheduled Meds:  enoxaparin (LOVENOX) injection  40 mg Subcutaneous Q24H   feeding supplement  237 mL Oral BID BM   pantoprazole  40 mg Oral Daily   polyethylene glycol  17 g Oral Daily    potassium chloride   40 mEq Oral Once   senna-docusate  1 tablet Oral BID   Continuous Infusions:  sodium chloride  100 mL/hr at 12/14/23 0643   cefTRIAXone (ROCEPHIN)  IV 1 g (12/13/23 2146)     LOS: 1 day    Time spent: 35 Minutes.     Owen DELENA Lore, MD [facility]   If 7PM-7AM, please contact night-coverage [email or web address]  12/14/2023, 8:44 AM

## 2023-12-15 DIAGNOSIS — W19XXXA Unspecified fall, initial encounter: Secondary | ICD-10-CM | POA: Diagnosis not present

## 2023-12-15 DIAGNOSIS — Y92009 Unspecified place in unspecified non-institutional (private) residence as the place of occurrence of the external cause: Secondary | ICD-10-CM | POA: Diagnosis not present

## 2023-12-15 DIAGNOSIS — T796XXA Traumatic ischemia of muscle, initial encounter: Secondary | ICD-10-CM | POA: Diagnosis not present

## 2023-12-15 LAB — BASIC METABOLIC PANEL WITH GFR
Anion gap: 9 (ref 5–15)
BUN: 20 mg/dL (ref 8–23)
CO2: 24 mmol/L (ref 22–32)
Calcium: 9.2 mg/dL (ref 8.9–10.3)
Chloride: 105 mmol/L (ref 98–111)
Creatinine, Ser: 0.4 mg/dL — ABNORMAL LOW (ref 0.44–1.00)
GFR, Estimated: >60 mL/min (ref >60)
Glucose, Bld: 91 mg/dL (ref 70–99)
Potassium: 3.8 mmol/L (ref 3.5–5.1)
Sodium: 139 mmol/L (ref 135–145)

## 2023-12-15 LAB — URINE CULTURE: Culture: >=100000 — AB

## 2023-12-15 MED ORDER — IPRATROPIUM-ALBUTEROL 0.5-2.5 (3) MG/3ML IN SOLN
3.0000 mL | Freq: Four times a day (QID) | RESPIRATORY_TRACT | Status: DC | PRN
Start: 2023-12-15 — End: 2023-12-19

## 2023-12-15 MED ORDER — CEPHALEXIN 500 MG PO CAPS
500.0000 mg | ORAL_CAPSULE | Freq: Three times a day (TID) | ORAL | Status: AC
  Administered 2023-12-15 – 2023-12-16 (×3): 500 mg via ORAL
  Filled 2023-12-15 (×3): qty 1

## 2023-12-15 NOTE — Plan of Care (Signed)
  Problem: Clinical Measurements: Goal: Ability to maintain clinical measurements within normal limits will improve Outcome: Progressing   Problem: Activity: Goal: Risk for activity intolerance will decrease Outcome: Progressing   Problem: Nutrition: Goal: Adequate nutrition will be maintained Outcome: Progressing   Problem: Coping: Goal: Level of anxiety will decrease Outcome: Progressing   Problem: Pain Managment: Goal: General experience of comfort will improve and/or be controlled Outcome: Progressing   Problem: Safety: Goal: Ability to remain free from injury will improve Outcome: Progressing

## 2023-12-15 NOTE — Progress Notes (Signed)
 Physical Therapy Treatment Patient Details Name: Lisa Ward MRN: [id number] DOB: 1945-10-09 Today's Date: 12/15/2023   History of Present Illness Patient is an 78 year old female who presented to the ED 12/12/23 for evaluation after a fall at home and prolonged time down after hitting her head on car door.  Concern right fibular head Fx; ortho consult pending.  Dx with rhabdomyolysis & UTI.  PMHx includes HTN, HLD, vertigo (per pt), appendectomy, cholecystectomy, hysterectomy, OA, osteoporosis, right THA, right TKA    PT Comments  Today's PT session focused on bed mobility, seated balance, and sit to stand transfers. Pt performed bed mobility with MAX-Total A +2, demonstrated improved seated balance compared to previous session, and performed sit to stand transfers with MOD-MAX A+2.Pt remains limited by increased pain levels during mobility. Pt will benefit from continued skilled PT to increase their independence and maximize safety with mobility.      If plan is discharge home, recommend the following: Two people to help with walking and/or transfers;A lot of help with bathing/dressing/bathroom;Assist for transportation;Help with stairs or ramp for entrance;Assistance with cooking/housework   Can travel by private vehicle     No  Equipment Recommendations  Other (comment) (defer to next level of care)    Recommendations for Other Services       Precautions / Restrictions Precautions Precautions: Fall Precaution/Restrictions Comments: Per reorder of PT comments CT knee negative for fracture ok to work with patient Restrictions Weight Bearing Restrictions Per Provider Order: No Other Position/Activity Restrictions: CT knee negative for fracture ok to work with patient per PT order     Mobility  Bed Mobility Overal bed mobility: Needs Assistance Bed Mobility: Supine to Sit, Sit to Supine   Sidelying to sit: Max assist, +2 for physical assistance, +2 for  safety/equipment, HOB elevated, Used rails Supine to sit: +2 for physical assistance, +2 for safety/equipment, Used rails, Total assist     General bed mobility comments: patient reporting pain maxislide  placed under bed pad to facilitate bed mobility and postion to sitting at EOB. Intermittent posterior leanign sitting EOB. Multimodal cues to correct.  Pt attempted to asisst with sit to supine with increased difficulty ultimately total A +2 to lift legs and  assist trunk back to supine.    Transfers Overall transfer level: Needs assistance Equipment used: Rolling walker (2 wheels) Transfers: Sit to/from Stand Sit to Stand: Max assist, +2 physical assistance, +2 safety/equipment           General transfer comment: trial x2 from EOB with MAX A+2 initially and heavy MOD A+2 2nd stand with use of RW. Cues for uprigh posture in standing. Pt with increased fear/anxiety with mobility.    Ambulation/Gait                   Stairs             Wheelchair Mobility     Tilt Bed    Modified Rankin (Stroke Patients Only)       Balance Overall balance assessment: Needs assistance, History of Falls Sitting-balance support: Single extremity supported, Feet supported Sitting balance-Leahy Scale: Fair Sitting balance - Comments: CGA-supervision with use of singel UE support on bed rail- intermittent posterior leaning Postural control: Posterior lean Standing balance support: Bilateral upper extremity supported, During functional activity, Reliant on assistive device for balance Standing balance-Leahy Scale: Poor  Communication Communication Communication: No apparent difficulties  Cognition Arousal: Alert Behavior During Therapy: WFL for tasks assessed/performed   PT - Cognitive impairments: No apparent impairments                         Following commands: Intact      Cueing    Exercises      General  Comments        Pertinent Vitals/Pain Pain Assessment Pain Assessment: Faces Faces Pain Scale: Hurts even more Pain Location: pain B LEs Pain Descriptors / Indicators: Discomfort, Grimacing, Guarding Pain Intervention(s): Limited activity within patient's tolerance, Monitored during session, Premedicated before session, Repositioned    Home Living                          Prior Function            PT Goals (current goals can now be found in the care plan section) Acute Rehab PT Goals Patient Stated Goal: to walk PT Goal Formulation: With patient Time For Goal Achievement: 12/27/23 Potential to Achieve Goals: Fair Progress towards PT goals: Progressing toward goals    Frequency    Min 2X/week      PT Plan      Co-evaluation              AM-PAC PT 6 Clicks Mobility   Outcome Measure  Help needed turning from your back to your side while in a flat bed without using bedrails?: A Lot Help needed moving from lying on your back to sitting on the side of a flat bed without using bedrails?: Total Help needed moving to and from a bed to a chair (including a wheelchair)?: Total Help needed standing up from a chair using your arms (e.g., wheelchair or bedside chair)?: Total Help needed to walk in hospital room?: Total Help needed climbing 3-5 steps with a railing? : Total 6 Click Score: 7    End of Session Equipment Utilized During Treatment: Gait belt Activity Tolerance: Patient tolerated treatment well Patient left: in bed;with call bell/phone within reach Nurse Communication: Mobility status PT Visit Diagnosis: Unsteadiness on feet (R26.81);History of falling (Z91.81);Muscle weakness (generalized) (M62.81);Pain Pain - Right/Left:  (bilateral) Pain - part of body: Leg     Time: 8541-8478 PT Time Calculation (min) (ACUTE ONLY): 23 min  Charges:    $Therapeutic Activity: 23-37 mins PT General Charges $$ ACUTE PT VISIT: 1 Visit                     Tinnie BERRY PT, DPT  Acute Rehabilitation Services  Office [phone]   12/15/2023, 4:01 PM

## 2023-12-15 NOTE — Progress Notes (Signed)
 PROGRESS NOTE    Lisa Ward  FMW:[id number] DOB: December 02, 1945 DOA: 12/12/2023 PCP: Claudene Pellet, MD    Brief Narrative:  78 year old with history of hypertension, hyperlipidemia presented to the ER after she was found fallen at home for prolonged time down.  Patient had recently moved with her son.  However she had come back to collect some mails.  She had also bitten her head onto the car door before reaching home.  She was on the floor for 2 days, covered in urine. In the emergency room, hemodynamically stable.  Found to have mild rhabdomyolysis.  Could not move.  Admitted.  Also found to have UTI.  Subjective: Patient seen and examined.  Denies any complaints today except some back discomfort.  Remains afebrile.  She is aware about plan for rehab before going to live with her son. Assessment & Plan:   Fall at home, traumatic rhabdomyolysis, moderate dehydration, hypercalcemia: Skeletal survey negative.  There was question about right knee periprosthetic fracture, CT scan was normal. Adequate pain management with tramadol , bowel regimen.  Work with PT OT.  Stable to refer to SNF. CK levels trending down and now patient is able to take by mouth.  Acute UTI, present on admission: Urine culture with pansensitive E. coli.  Rocephin 2 days completed.  Keflex 500 mg 3 times daily for additional 3 days.  Hyperlipidemia: Continue to hold statin until outpatient follow-up.  Hypertension: Holding lisinopril and chlorothiazide.  Currently negative for orthostatic.  Wound 12/12/23 2357 Pressure Injury Sacrum Left Stage 2 -  Partial thickness loss of dermis presenting as a shallow open injury with a red, pink wound bed without slough. (Active)        DVT prophylaxis: enoxaparin (LOVENOX) injection 40 mg Start: 12/13/23 1000   Code Status: DNR/DNI Family Communication: None at the bedside Disposition Plan: Status is: Inpatient Remains inpatient appropriate because: Medically  stabilized.  Waiting for SNF bed.     Consultants:  Orthopedics  Procedures:  None  Antimicrobials:  Rocephin 11/5-11/7 Keflex 11/8--     Objective: Vitals:   12/14/23 2049 12/15/23 0509 12/15/23 0656 12/15/23 0700  BP: 139/62 (!) 143/69    Pulse: 71 75    Resp: 14 15    Temp: 98.4 F (36.9 C) 97.9 F (36.6 C)    TempSrc: Oral Oral    SpO2: 93% 90% 92% 91%  Weight:      Height:        Intake/Output Summary (Last 24 hours) at 12/15/2023 1045 Last data filed at 12/15/2023 0936 Gross per 24 hour  Intake 760 ml  Output 750 ml  Net 10 ml   Filed Weights   12/12/23 2352  Weight: 81.4 kg    Examination:  General exam: Appears calm and comfortable.  Pleasant and interactive. Respiratory system: Clear to auscultation. Respiratory effort normal.  No added sounds. Cardiovascular system: S1 & S2 heard, RRR. No JVD, murmurs, rubs, gallops or clicks. No pedal edema. Gastrointestinal system: Soft.  Nontender. Central nervous system: Alert and oriented. No focal neurological deficits.  Gross generalized weakness. No palpable tenderness.    Data Reviewed: I have personally reviewed following labs and imaging studies  CBC: Recent Labs  Lab 12/12/23 2015 12/13/23 0507 12/14/23 0447  WBC 31.0* 22.7* 12.7*  NEUTROABS 26.8*  --   --   HGB 16.2* 14.0 12.3  HCT 51.2* 44.5 39.4  MCV 92.1 93.5 95.4  PLT 367 280 223   Basic Metabolic Panel: Recent Labs  Lab  12/12/23 2015 12/13/23 0507 12/14/23 0447 12/15/23 0525  NA 141 143 140 139  K 3.9 3.2* 3.3* 3.8  CL 98 103 106 105  CO2 28 30 27 24   GLUCOSE 135* 113* 93 91  BUN 29* 31* 25* 20  CREATININE 0.48 0.47 0.41* 0.40*  CALCIUM  10.9* 9.5 9.0 9.2   GFR: Estimated Creatinine Clearance: 57.9 mL/min (A) (by C-G formula based on SCr of 0.4 mg/dL (L)). Liver Function Tests: Recent Labs  Lab 12/12/23 2015 12/13/23 0507 12/14/23 0447  AST 63* 56* 45*  ALT 28 25 24   ALKPHOS 86 65 56  BILITOT 1.2 0.9 0.7  PROT  7.7 6.1* 5.6*  ALBUMIN  4.2 3.4* 3.1*   No results for input(s): LIPASE, AMYLASE in the last 168 hours. No results for input(s): AMMONIA in the last 168 hours. Coagulation Profile: Recent Labs  Lab 12/12/23 2015  INR 0.9   Cardiac Enzymes: Recent Labs  Lab 12/12/23 2015 12/13/23 0507 12/14/23 0447  CKTOTAL 898* 957* 440*   BNP (last 3 results) No results for input(s): PROBNP in the last 8760 hours. HbA1C: No results for input(s): HGBA1C in the last 72 hours. CBG: No results for input(s): GLUCAP in the last 168 hours. Lipid Profile: No results for input(s): CHOL, HDL, LDLCALC, TRIG, CHOLHDL, LDLDIRECT in the last 72 hours. Thyroid  Function Tests: No results for input(s): TSH, T4TOTAL, FREET4, T3FREE, THYROIDAB in the last 72 hours. Anemia Panel: No results for input(s): VITAMINB12, FOLATE, FERRITIN, TIBC, IRON, RETICCTPCT in the last 72 hours. Sepsis Labs: No results for input(s): PROCALCITON, LATICACIDVEN in the last 168 hours.  Recent Results (from the past 240 hours)  Urine Culture (for pregnant, neutropenic or urologic patients or patients with an indwelling urinary catheter)     Status: Abnormal   Collection Time: 12/12/23  8:13 PM   Specimen: Urine, Clean Catch  Result Value Ref Range Status   Specimen Description   Final    URINE, CLEAN CATCH Performed at [facility], [address], [place], [place] [address]    Special Requests   Final    NONE Performed at [facility], [address], [place], [place] [address]    Culture >=100,000 COLONIES/mL ESCHERICHIA COLI (A)  Final   Report Status 12/15/2023 FINAL  Final   Organism ID, Bacteria ESCHERICHIA COLI (A)  Final      Susceptibility   Escherichia coli - MIC*    AMPICILLIN <=2 SENSITIVE Sensitive     CEFAZOLIN  (URINE) Value in next row Sensitive      <=1 SENSITIVEThis is a modified FDA-approved test that has been  validated and its performance characteristics determined by the reporting laboratory.  This laboratory is certified under the Clinical Laboratory Improvement Amendments CLIA as qualified to perform high complexity clinical laboratory testing.    CEFEPIME Value in next row Sensitive      <=1 SENSITIVEThis is a modified FDA-approved test that has been validated and its performance characteristics determined by the reporting laboratory.  This laboratory is certified under the Clinical Laboratory Improvement Amendments CLIA as qualified to perform high complexity clinical laboratory testing.    ERTAPENEM Value in next row Sensitive      <=1 SENSITIVEThis is a modified FDA-approved test that has been validated and its performance characteristics determined by the reporting laboratory.  This laboratory is certified under the Clinical Laboratory Improvement Amendments CLIA as qualified to perform high complexity clinical laboratory testing.    CEFTRIAXONE Value in next row Sensitive      <=  1 SENSITIVEThis is a modified FDA-approved test that has been validated and its performance characteristics determined by the reporting laboratory.  This laboratory is certified under the Clinical Laboratory Improvement Amendments CLIA as qualified to perform high complexity clinical laboratory testing.    CIPROFLOXACIN Value in next row Sensitive      <=1 SENSITIVEThis is a modified FDA-approved test that has been validated and its performance characteristics determined by the reporting laboratory.  This laboratory is certified under the Clinical Laboratory Improvement Amendments CLIA as qualified to perform high complexity clinical laboratory testing.    GENTAMICIN Value in next row Sensitive      <=1 SENSITIVEThis is a modified FDA-approved test that has been validated and its performance characteristics determined by the reporting laboratory.  This laboratory is certified under the Clinical Laboratory Improvement Amendments  CLIA as qualified to perform high complexity clinical laboratory testing.    NITROFURANTOIN Value in next row Sensitive      <=1 SENSITIVEThis is a modified FDA-approved test that has been validated and its performance characteristics determined by the reporting laboratory.  This laboratory is certified under the Clinical Laboratory Improvement Amendments CLIA as qualified to perform high complexity clinical laboratory testing.    TRIMETH/SULFA Value in next row Sensitive      <=1 SENSITIVEThis is a modified FDA-approved test that has been validated and its performance characteristics determined by the reporting laboratory.  This laboratory is certified under the Clinical Laboratory Improvement Amendments CLIA as qualified to perform high complexity clinical laboratory testing.    AMPICILLIN/SULBACTAM Value in next row Sensitive      <=1 SENSITIVEThis is a modified FDA-approved test that has been validated and its performance characteristics determined by the reporting laboratory.  This laboratory is certified under the Clinical Laboratory Improvement Amendments CLIA as qualified to perform high complexity clinical laboratory testing.    PIP/TAZO Value in next row Sensitive      <=4 SENSITIVEThis is a modified FDA-approved test that has been validated and its performance characteristics determined by the reporting laboratory.  This laboratory is certified under the Clinical Laboratory Improvement Amendments CLIA as qualified to perform high complexity clinical laboratory testing.    MEROPENEM Value in next row Sensitive      <=4 SENSITIVEThis is a modified FDA-approved test that has been validated and its performance characteristics determined by the reporting laboratory.  This laboratory is certified under the Clinical Laboratory Improvement Amendments CLIA as qualified to perform high complexity clinical laboratory testing.    * >=100,000 COLONIES/mL ESCHERICHIA COLI         Radiology  Studies: CT KNEE RIGHT WO CONTRAST Result Date: 12/14/2023 EXAM: CT RIGHT KNEE, WITHOUT IV CONTRAST 12/14/2023 11:12:22 AM TECHNIQUE: Axial images were acquired through the right knee without IV contrast. Reformatted images were reviewed. Automated exposure control, iterative reconstruction, and/or weight based adjustment of the mA/kV was utilized to reduce the radiation dose to as low as reasonably achievable. COMPARISON: None provided. CLINICAL HISTORY: Knee trauma, tibial plateau fracture. FINDINGS: BONES: Right total knee prosthesis. Mild spurring of the proximal fibula. No discrete fracture is readily apparent. JOINTS: No dislocation. No knee effusion. SOFT TISSUES: Prominent semimembranosus muscular atrophy as on image 10 series 7. IMPRESSION: 1. No definite fracture identified. No knee effusion. 2. Right total knee prosthesis. 3. Mild degenerative spurring at the proximal fibula. 4. Prominent semimembranosus muscular atrophy. Electronically signed by: Ryan Salvage MD 12/14/2023 11:44 AM EST RP Workstation: [id number]        Scheduled Meds:  cephALEXin  500 mg Oral Q8H   diclofenac Sodium  2 g Topical QID   enoxaparin (LOVENOX) injection  40 mg Subcutaneous Q24H   feeding supplement  237 mL Oral BID BM   fluticasone  1 spray Each Nare Daily   ipratropium-albuterol  3 mL Nebulization BID   pantoprazole  40 mg Oral Daily   polyethylene glycol  17 g Oral Daily   senna-docusate  1 tablet Oral BID   Continuous Infusions:   LOS: 2 days    Time spent: 40 minutes    Renato Applebaum, MD [facility]

## 2023-12-16 DIAGNOSIS — L8990 Pressure ulcer of unspecified site, unspecified stage: Secondary | ICD-10-CM | POA: Insufficient documentation

## 2023-12-16 DIAGNOSIS — M6282 Rhabdomyolysis: Secondary | ICD-10-CM | POA: Diagnosis not present

## 2023-12-16 DIAGNOSIS — N390 Urinary tract infection, site not specified: Secondary | ICD-10-CM | POA: Diagnosis present

## 2023-12-16 DIAGNOSIS — E66811 Obesity, class 1: Secondary | ICD-10-CM

## 2023-12-16 DIAGNOSIS — Z66 Do not resuscitate: Secondary | ICD-10-CM | POA: Diagnosis present

## 2023-12-16 MED ORDER — IRBESARTAN 150 MG PO TABS
150.0000 mg | ORAL_TABLET | Freq: Every day | ORAL | Status: DC
  Administered 2023-12-16 – 2023-12-19 (×4): 150 mg via ORAL
  Filled 2023-12-16 (×4): qty 1

## 2023-12-16 NOTE — Assessment & Plan Note (Addendum)
 Wound 12/12/23 2357 Pressure Injury Sacrum Left Stage 2 -  Partial thickness loss of dermis presenting as a shallow open injury with a red, pink wound bed without slough. (Active)  POA

## 2023-12-16 NOTE — Assessment & Plan Note (Signed)
 Continue to hold statin until outpatient follow-up.

## 2023-12-16 NOTE — Assessment & Plan Note (Addendum)
 Holding olmesartan Will resume based on current BP 156/69, irbesartan per formulary

## 2023-12-16 NOTE — Plan of Care (Signed)
  Problem: Clinical Measurements: Goal: Ability to maintain clinical measurements within normal limits will improve Outcome: Progressing   Problem: Nutrition: Goal: Adequate nutrition will be maintained Outcome: Progressing   Problem: Coping: Goal: Level of anxiety will decrease Outcome: Progressing   Problem: Pain Managment: Goal: General experience of comfort will improve and/or be controlled Outcome: Progressing   Problem: Safety: Goal: Ability to remain free from injury will improve Outcome: Progressing

## 2023-12-16 NOTE — Assessment & Plan Note (Signed)
 Body mass index is 30.8 kg/m.SABRA  Weight loss should be encouraged Outpatient PCP/bariatric medicine f/u encouraged Significantly low or high BMI is associated with higher medical risk including morbidity and mortality

## 2023-12-16 NOTE — Progress Notes (Signed)
 Physical Therapy Treatment Patient Details Name: Lisa Ward MRN: [id number] DOB: [birth year] Today's Date: 12/16/2023   History of Present Illness Patient is an 78 year old female who presented to the ED 12/12/23 for evaluation after a fall at home and prolonged time down after hitting her head on car door.  Concern right fibular head Fx; ortho consult pending.  Dx with rhabdomyolysis & UTI.  PMHx includes HTN, HLD, vertigo (per pt), appendectomy, cholecystectomy, hysterectomy, OA, osteoporosis, right THA, right TKA    PT Comments  Today's PT session focused on bed mobility and transfers. Pt with increased L knee pain this date requiring increased assist with sit to stands and limited tolerance with continued mobility. Pt performed bed mobility with MAX-Total A +2 and sit to stand transfers with use of RW and sara stedy with MAX A+2 with multimodal cues for hand placement and upright posture. Pt will benefit from continued skilled PT to increase their independence and maximize safety with mobility.      If plan is discharge home, recommend the following: Two people to help with walking and/or transfers;A lot of help with bathing/dressing/bathroom;Assist for transportation;Help with stairs or ramp for entrance;Assistance with cooking/housework   Can travel by private vehicle     No  Equipment Recommendations  Other (comment) (defer to next level of care)    Recommendations for Other Services       Precautions / Restrictions Precautions Precautions: Fall Restrictions Weight Bearing Restrictions Per Provider Order: No     Mobility  Bed Mobility Overal bed mobility: Needs Assistance Bed Mobility: Supine to Sit, Rolling, Sit to Supine Rolling: Max assist, +2 for physical assistance, +2 for safety/equipment, Used rails   Supine to sit: Max assist, +2 for physical assistance, +2 for safety/equipment, HOB elevated, Used rails Sit to supine: Total assist, +2 for safety/equipment,  +2 for physical assistance, Used rails   General bed mobility comments: still requires MAX +2, but pt with improved initiation of LEs to EOB R>L. attempts to scoot forward toward EOB independently- ultimately requires assistwith chuck pad and maxislide. Contiued difficulty with sit to supine requiring total A.    Transfers Overall transfer level: Needs assistance Equipment used: Rolling walker (2 wheels) (& sara stedy) Transfers: Sit to/from Stand Sit to Stand: Max assist, +2 physical assistance, +2 safety/equipment           General transfer comment: trial x2 from EOB with MAX A+2 with use of RW. 2nd trial with use of sara stedy for goal of up to recliner and increased diffuclty with upright standing and unable to bring seat flaps under pt's hips- pt with reported increased pain in L knee this date with mobility and WB.  Cues for upright posture/hip extension in standing. Pt deferring further mobility due to increased pain levels- Pt apologetic for not completing more, but unable to tolerate due to pain. Transfer via Lift Equipment: Stedy  Ambulation/Gait                   Stairs             Wheelchair Mobility     Tilt Bed    Modified Rankin (Stroke Patients Only)       Balance Overall balance assessment: Needs assistance, History of Falls Sitting-balance support: Single extremity supported, Feet supported Sitting balance-Leahy Scale: Fair Sitting balance - Comments: CGA-supervision with use of singel UE support on bed rail- intermittent posterior leaning   Standing balance support: Bilateral upper extremity supported,  During functional activity, Reliant on assistive device for balance Standing balance-Leahy Scale: Poor                              Communication Communication Communication: No apparent difficulties  Cognition Arousal: Alert Behavior During Therapy: WFL for tasks assessed/performed   PT - Cognitive impairments: No apparent  impairments                         Following commands: Intact      Cueing    Exercises      General Comments General comments (skin integrity, edema, etc.): pt on 2L upon PT entry able to tolerate sessions with O2 92-95% throughout.      Pertinent Vitals/Pain Pain Assessment Pain Assessment: 0-10 Pain Score: 8  Pain Location: L knee down to ankle Pain Descriptors / Indicators: Throbbing Pain Intervention(s): Limited activity within patient's tolerance, Monitored during session, Premedicated before session, Repositioned, Ice applied    Home Living                          Prior Function            PT Goals (current goals can now be found in the care plan section) Acute Rehab PT Goals Patient Stated Goal: to walk PT Goal Formulation: With patient Time For Goal Achievement: 12/27/23 Potential to Achieve Goals: Fair Progress towards PT goals: Not progressing toward goals - comment    Frequency    Min 2X/week      PT Plan      Co-evaluation              AM-PAC PT 6 Clicks Mobility   Outcome Measure  Help needed turning from your back to your side while in a flat bed without using bedrails?: Total Help needed moving from lying on your back to sitting on the side of a flat bed without using bedrails?: Total Help needed moving to and from a bed to a chair (including a wheelchair)?: Total Help needed standing up from a chair using your arms (e.g., wheelchair or bedside chair)?: Total Help needed to walk in hospital room?: Total Help needed climbing 3-5 steps with a railing? : Total 6 Click Score: 6    End of Session Equipment Utilized During Treatment: Gait belt Activity Tolerance: Patient limited by pain Patient left: in bed;with call bell/phone within reach Nurse Communication: Mobility status PT Visit Diagnosis: Unsteadiness on feet (R26.81);History of falling (Z91.81);Muscle weakness (generalized) (M62.81);Pain Pain -  Right/Left: Left Pain - part of body: Leg;Knee     Time: 1133-1209 PT Time Calculation (min) (ACUTE ONLY): 36 min  Charges:    $Therapeutic Activity: 23-37 mins PT General Charges $$ ACUTE PT VISIT: 1 Visit                     Tinnie BERRY PT, DPT  Acute Rehabilitation Services  Office [phone]  12/16/2023, 12:43 PM

## 2023-12-16 NOTE — Assessment & Plan Note (Signed)
 DNR confirmed at the time of admission and again today Patient will need a gold out of facility DNR form at the time of discharge

## 2023-12-16 NOTE — Assessment & Plan Note (Addendum)
 Reported hitting her head on the car door and then driving home [place] after arriving home Down 2-3 days prior to family finding her Moderate dehydration, hypercalcemia on presentation - resolved with hydration Skeletal survey negative Adequate pain management with tramadol , bowel regimen PT/OT consulted, recommending STR in SNF CK levels trending down and now patient is able to eat/drink She is medically stable for dc once a bed is available

## 2023-12-16 NOTE — Assessment & Plan Note (Addendum)
 Urine culture with pansensitive E. Coli Completed antibiotics (Ceftriaxone -> cephalexin)

## 2023-12-16 NOTE — Plan of Care (Signed)
 ?  Problem: Clinical Measurements: ?Goal: Ability to maintain clinical measurements within normal limits will improve ?Outcome: Progressing ?Goal: Will remain free from infection ?Outcome: Progressing ?Goal: Diagnostic test results will improve ?Outcome: Progressing ?  ?

## 2023-12-16 NOTE — Progress Notes (Signed)
 Progress Note   Patient: Lisa Ward FMW:[id number] DOB: [birth year] DOA: 12/12/2023     3 DOS: the patient was seen and examined on 12/16/2023   Brief hospital course: Lisa Ward is a 78 y.o. female with medical history significant for HTN, HLD who is admitted with elevated CK/early rhabdomyolysis after a fall at home with prolonged downtime.    Assessment & Plan Rhabdomyolysis Fall at home, initial encounter Reported hitting her head on the car door and then driving home [place] after arriving home Down 2-3 days prior to family finding her Moderate dehydration, hypercalcemia on presentation - resolved with hydration Skeletal survey negative Adequate pain management with tramadol , bowel regimen PT/OT consulted, recommending STR in SNF CK levels trending down and now patient is able to eat/drink She is medically stable for dc once a bed is available Acute UTI (urinary tract infection) Urine culture with pansensitive E. Coli Completed antibiotics (Ceftriaxone -> cephalexin) HTN (hypertension) Holding olmesartan Will resume based on current BP 156/69, irbesartan per formulary HLD (hyperlipidemia) Continue to hold statin until outpatient follow-up.  Pressure injury of skin Wound 12/12/23 2357 Pressure Injury Sacrum Left Stage 2 -  Partial thickness loss of dermis presenting as a shallow open injury with a red, pink wound bed without slough. (Active)  POA Class 1 obesity due to excess calories with body mass index (BMI) of 30.0 to 30.9 in adult Body mass index is 30.8 kg/m.SABRA  Weight loss should be encouraged Outpatient PCP/bariatric medicine f/u encouraged Significantly low or high BMI is associated with higher medical risk including morbidity and mortality  DNR (do not resuscitate) DNR confirmed at the time of admission and again today Patient will need a gold out of facility DNR form at the time of discharge       Consultants: PT OT TOC  team  Procedures: None  Antibiotics: Ceftriaxone 11/5-8 Cephalexin 11/8-9  30 Day Unplanned Readmission Risk Score    Flowsheet Row ED to Hosp-Admission (Current) from 12/12/2023 in [facility]  30 Day Unplanned Readmission Risk Score (%) 9.53 Filed at 12/16/2023 0401    This score is the patient's risk of an unplanned readmission within 30 days of being discharged (0 -100%). The score is based on dignosis, age, lab data, medications, orders, and past utilization.   Low:  0-14.9   Medium: 15-21.9   High: 22-29.9   Extreme: 30 and above           Subjective: Feeling ok, sad because she isn't able to walk yet.  Eager to go to facility.  Answered the [facility] MUST questions and now ready to try to arrange for admission to a facility in [facility], hopefully [facility], as soon as tomorrow.   Objective: Vitals:   12/16/23 0553 12/16/23 1408  BP: (!) 157/64 (!) 156/69  Pulse: 70 73  Resp: 15 18  Temp: 98 F (36.7 C) 98.6 F (37 C)  SpO2: 96% 96%    Intake/Output Summary (Last 24 hours) at 12/16/2023 1741 Last data filed at 12/16/2023 1400 Gross per 24 hour  Intake 730 ml  Output 600 ml  Net 130 ml   Filed Weights   12/12/23 2352  Weight: 81.4 kg    Exam:  General:  Appears calm and comfortable and is in NAD Eyes:  normal lids, iris ENT:  grossly normal hearing, lips & tongue, mmm Cardiovascular:  RRR. No LE edema.  Respiratory:   CTA bilaterally with no wheezes/rales/rhonchi.  Normal respiratory effort.  Abdomen:  soft, NT, ND Skin:  bruising of L hand with finger excoriations Musculoskeletal:  grossly normal tone BUE/BLE, good ROM, no bony abnormality Psychiatric:  grossly normal mood and affect, speech fluent and appropriate, AOx3 Neurologic:  CN 2-12 grossly intact, moves all extremities in coordinated fashion  Data Reviewed: I have reviewed the patient's lab results since admission.  Pertinent labs for today include:   None today      Family Communication: Son was present  Mobility: PT/OT Consulted and are recommending - Skilled Nursing-Short Term Rehab (<3 Hours/Day)12/16/2023 1116    Code Status: Limited: Do not attempt resuscitation (DNR) -DNR-LIMITED -Do Not Intubate/DNI    Disposition: Status is: Inpatient Remains inpatient appropriate because: awaiting placement     Time spent: 35 minutes  Unresulted Labs (From admission, onward)    None        Author: Delon Herald, MD 12/16/2023 5:41 PM  For on call review [email or web address].

## 2023-12-17 DIAGNOSIS — M6282 Rhabdomyolysis: Secondary | ICD-10-CM | POA: Diagnosis not present

## 2023-12-17 NOTE — Progress Notes (Signed)
 Progress Note   Patient: Lisa Ward FMW:[id number] DOB: 1945/09/16 DOA: 12/12/2023     4 DOS: the patient was seen and examined on 12/17/2023   Brief hospital course: Lisa Ward is a 78 y.o. female with medical history significant for HTN, HLD who is admitted with elevated CK/early rhabdomyolysis after a fall at home with prolonged downtime.   Assessment & Plan Rhabdomyolysis Fall at home, initial encounter Reported hitting her head on the car door and then driving home [place] after arriving home Down 2-3 days prior to family finding her Moderate dehydration, hypercalcemia on presentation - resolved with hydration Skeletal survey negative Adequate pain management with tramadol , bowel regimen PT/OT consulted, recommending STR in SNF CK levels trending down and now patient is able to eat/drink She is medically stable for dc once a bed is available Acute UTI (urinary tract infection) Urine culture with pansensitive E. Coli Completed antibiotics (Ceftriaxone -> cephalexin) HTN (hypertension) Holding olmesartan Will resume based on current BP 156/69, irbesartan per formulary HLD (hyperlipidemia) Continue to hold statin until outpatient follow-up.  Pressure injury of skin Wound 12/12/23 2357 Pressure Injury Sacrum Left Stage 2 -  Partial thickness loss of dermis presenting as a shallow open injury with a red, pink wound bed without slough. (Active)  POA Class 1 obesity due to excess calories with body mass index (BMI) of 30.0 to 30.9 in adult Body mass index is 30.8 kg/m.SABRA  Weight loss should be encouraged Outpatient PCP/bariatric medicine f/u encouraged Significantly low or high BMI is associated with higher medical risk including morbidity and mortality  DNR (do not resuscitate) DNR confirmed at the time of admission and again today Patient will need a gold out of facility DNR form at the time of discharge       Consultants: PT OT TOC team    Procedures: None   Antibiotics: Ceftriaxone 11/5-8 Cephalexin 11/8-9  30 Day Unplanned Readmission Risk Score    Flowsheet Row ED to Hosp-Admission (Current) from 12/12/2023 in [facility]  30 Day Unplanned Readmission Risk Score (%) 9.33 Filed at 12/17/2023 1600    This score is the patient's risk of an unplanned readmission within 30 days of being discharged (0 -100%). The score is based on dignosis, age, lab data, medications, orders, and past utilization.   Low:  0-14.9   Medium: 15-21.9   High: 22-29.9   Extreme: 30 and above           Subjective: Feeling ok, awaiting SNF placement.  Her primary concern today was an inability to work the TV remote control.   Objective: Vitals:   12/17/23 0500 12/17/23 1315  BP: (!) 141/75 (!) 155/68  Pulse: 76 76  Resp: 18 16  Temp: 98 F (36.7 C) 98.6 F (37 C)  SpO2: 100% 96%    Intake/Output Summary (Last 24 hours) at 12/17/2023 1641 Last data filed at 12/17/2023 1315 Gross per 24 hour  Intake 1060 ml  Output 1650 ml  Net -590 ml   Filed Weights   12/12/23 2352  Weight: 81.4 kg    Exam:  General:  Appears calm and comfortable and is in NAD Eyes:  normal lids, iris ENT:  grossly normal hearing, lips & tongue, mmm Cardiovascular:  RRR. No LE edema.  Respiratory:   CTA bilaterally with no wheezes/rales/rhonchi.  Normal respiratory effort. Abdomen:  soft, NT, ND Skin:  no rash or induration seen on limited exam Musculoskeletal:  grossly normal tone BUE/BLE, good ROM, no  bony abnormality Psychiatric:  blunted mood and affect, speech fluent and mildly inappropriate, AOx3 Neurologic:  CN 2-12 grossly intact, moves all extremities in coordinated fashion  Data Reviewed: I have reviewed the patient's lab results since admission.  Pertinent labs for today include:   None today     Family Communication: None present  Mobility: PT/OT Consulted and are recommending - Skilled Nursing-Short Term Rehab  (<3 Hours/Day)12/16/2023 1116    Code Status: Limited: Do not attempt resuscitation (DNR) -DNR-LIMITED -Do Not Intubate/DNI   Barriers to discharge:  awaiting placement, FL-2 sent today, will need insurance authorization  Disposition: Status is: Inpatient Remains inpatient appropriate because: awaiting placement     Time spent: 35 minutes  Unresulted Labs (From admission, onward)     Start     Ordered   12/18/23 0500  CBC  Tomorrow morning,   R        12/17/23 1641   Unscheduled  Comprehensive metabolic panel with GFR  Tomorrow morning,   R        12/17/23 1641             Author: Delon Herald, MD 12/17/2023 4:41 PM  For on call review [email or web address].

## 2023-12-17 NOTE — Assessment & Plan Note (Deleted)
 Urine culture with pansensitive E. Coli Completed antibiotics (Ceftriaxone -> cephalexin)

## 2023-12-17 NOTE — Assessment & Plan Note (Deleted)
 Body mass index is 30.8 kg/m.SABRA  Weight loss should be encouraged Outpatient PCP/bariatric medicine f/u encouraged Significantly low or high BMI is associated with higher medical risk including morbidity and mortality

## 2023-12-17 NOTE — Assessment & Plan Note (Addendum)
 Wound 12/12/23 2357 Pressure Injury Sacrum Left Stage 2 -  Partial thickness loss of dermis presenting as a shallow open injury with a red, pink wound bed without slough. (Active)  POA

## 2023-12-17 NOTE — NC FL2 (Addendum)
 [place]  MEDICAID FL2 LEVEL OF CARE FORM     IDENTIFICATION  Patient Name: Lisa Ward Birthdate: 1945/11/02 Sex: female Admission Date (Current Location): 12/12/2023  [facility] and [place] Number:  Producer, Television/film/video and Address:  [facility],  [address] [place], [place] [address]      Provider Number: [id number]  Attending Physician Name and Address:  Barbarann Nest, MD  Relative Name and Phone Number:  Jerrye Zell Rhody)  [phone]    Current Level of Care: Hospital Recommended Level of Care: Skilled Nursing Facility Prior Approval Number:    Date Approved/Denied:   PASRR Number: [id number]  Discharge Plan: SNF    Current Diagnoses: Patient Active Problem List   Diagnosis Date Noted   Acute UTI (urinary tract infection) 12/16/2023   Pressure injury of skin 12/16/2023   Class 1 obesity due to excess calories with body mass index (BMI) of 30.0 to 30.9 in adult 12/16/2023   DNR (do not resuscitate) 12/16/2023   Rhabdomyolysis 12/12/2023   Fall at home, initial encounter 12/12/2023   Hypercalcemia 12/12/2023   HTN (hypertension)    HLD (hyperlipidemia)    OA (osteoarthritis) of hip 09/11/2018    Orientation RESPIRATION BLADDER Height & Weight     Self, Time, Situation, Place  Normal Continent Weight: 81.4 kg Height:  5' 4 (162.6 cm)  BEHAVIORAL SYMPTOMS/MOOD NEUROLOGICAL BOWEL NUTRITION STATUS      Continent Diet (Heart Healthy, Thin Liquids)  AMBULATORY STATUS COMMUNICATION OF NEEDS Skin   Extensive Assist Verbally PU Stage and Appropriate Care, Other (Comment), Skin abrasions (Pressure Injury Sacrum Left Stage 2, Abrasion- Rt elbow, Abrasion- Left posterior finger)                       Personal Care Assistance Level of Assistance  Bathing, Feeding, Dressing Bathing Assistance: Maximum assistance Feeding assistance: Limited assistance Dressing Assistance: Maximum assistance     Functional Limitations Info  Sight, Hearing,  Speech Sight Info: Impaired Hearing Info: Impaired Speech Info: Adequate    SPECIAL CARE FACTORS FREQUENCY  PT (By licensed PT), OT (By licensed OT)     PT Frequency: 5x/wk OT Frequency: 5x/wk            Contractures Contractures Info: Not present    Additional Factors Info  Code Status, Allergies Code Status Info: DNR Allergies Info: No Known Allergies           Current Medications (12/17/2023):  This is the current hospital active medication list Current Facility-Administered Medications  Medication Dose Route Frequency Provider Last Rate Last Admin   acetaminophen  (TYLENOL ) tablet 650 mg  650 mg Oral Q6H PRN Patel, Vishal R, MD   650 mg at 12/13/23 0911   Or   acetaminophen  (TYLENOL ) suppository 650 mg  650 mg Rectal Q6H PRN Patel, Vishal R, MD       diclofenac Sodium (VOLTAREN) 1 % topical gel 2 g  2 g Topical QID Regalado, Belkys A, MD   2 g at 12/17/23 1355   enoxaparin (LOVENOX) injection 40 mg  40 mg Subcutaneous Q24H Patel, Vishal R, MD   40 mg at 12/17/23 0835   feeding supplement (ENSURE PLUS HIGH PROTEIN) liquid 237 mL  237 mL Oral BID BM Patel, Vishal R, MD   237 mL at 12/17/23 0837   fluticasone (FLONASE) 50 MCG/ACT nasal spray 1 spray  1 spray Each Nare Daily Regalado, Belkys A, MD   1 spray at 12/17/23 0836   ipratropium-albuterol (  DUONEB) 0.5-2.5 (3) MG/3ML nebulizer solution 3 mL  3 mL Nebulization Q6H PRN Raenelle Coria, MD       irbesartan (AVAPRO) tablet 150 mg  150 mg Oral Daily Barbarann Nest, MD   150 mg at 12/17/23 0835   morphine  (PF) 2 MG/ML injection 1 mg  1 mg Intravenous Q4H PRN Regalado, Belkys A, MD   1 mg at 12/15/23 1742   ondansetron  (ZOFRAN ) tablet 4 mg  4 mg Oral Q6H PRN Patel, Vishal R, MD       Or   ondansetron  (ZOFRAN ) injection 4 mg  4 mg Intravenous Q6H PRN Patel, Vishal R, MD       pantoprazole (PROTONIX) EC tablet 40 mg  40 mg Oral Daily Regalado, Belkys A, MD   40 mg at 12/17/23 0835   polyethylene glycol (MIRALAX  / GLYCOLAX )  packet 17 g  17 g Oral Daily Regalado, Belkys A, MD   17 g at 12/17/23 0834   senna-docusate (Senokot-S) tablet 1 tablet  1 tablet Oral BID Regalado, Belkys A, MD   1 tablet at 12/17/23 0835   simethicone (MYLICON) chewable tablet 80 mg  80 mg Oral QID PRN Regalado, Belkys A, MD       traMADol  (ULTRAM ) tablet 50-100 mg  50-100 mg Oral Q6H PRN Regalado, Belkys A, MD   100 mg at 12/16/23 1659     Discharge Medications: Please see discharge summary for a list of discharge medications.  Relevant Imaging Results:  Relevant Lab Results:   Additional Information SSN: [id number]  Jon ONEIDA Anon, RN

## 2023-12-17 NOTE — Assessment & Plan Note (Addendum)
 Body mass index is 30.8 kg/m.SABRA  Weight loss should be encouraged Outpatient PCP/bariatric medicine f/u encouraged Significantly low or high BMI is associated with higher medical risk including morbidity and mortality

## 2023-12-17 NOTE — Assessment & Plan Note (Deleted)
 Holding olmesartan Will resume based on current BP 156/69, irbesartan per formulary

## 2023-12-17 NOTE — Assessment & Plan Note (Deleted)
 Wound 12/12/23 2357 Pressure Injury Sacrum Left Stage 2 -  Partial thickness loss of dermis presenting as a shallow open injury with a red, pink wound bed without slough. (Active)  POA

## 2023-12-17 NOTE — Assessment & Plan Note (Signed)
 Reported hitting her head on the car door and then driving home [place] after arriving home Down 2-3 days prior to family finding her Moderate dehydration, hypercalcemia on presentation - resolved with hydration Skeletal survey negative Adequate pain management with tramadol , bowel regimen PT/OT consulted, recommending STR in SNF CK levels trending down and now patient is able to eat/drink She is medically stable for dc once a bed is available

## 2023-12-17 NOTE — Assessment & Plan Note (Signed)
 Urine culture with pansensitive E. Coli Completed antibiotics (Ceftriaxone -> cephalexin)

## 2023-12-17 NOTE — Assessment & Plan Note (Signed)
 Holding olmesartan Will resume based on current BP 156/69, irbesartan per formulary

## 2023-12-17 NOTE — Plan of Care (Signed)
 ?  Problem: Clinical Measurements: ?Goal: Will remain free from infection ?Outcome: Progressing ?  ?

## 2023-12-17 NOTE — Assessment & Plan Note (Signed)
 Continue to hold statin until outpatient follow-up.

## 2023-12-17 NOTE — TOC Progression Note (Signed)
 Transition of Care [facility]) - Progression Note    Patient Details  Name: Lisa Ward MRN: [id number] Date of Birth: 1945/12/15  Transition of [facility] CM/SW Contact  Jon ONEIDA Anon, RN Phone Number: 12/17/2023, 3:31 PM  Clinical Narrative:    PASRR number received. SNF referrals sent out to the surrounding area. Need FL2 signed. NCM spoke with Darice, Admissions Coordinator at [facility] at [phone], option 5. States she will review for possible admission for STR. SNF Referral faxed out to [facility] at [phone], awaiting on if they can accept for STR. If accepted will require transportation as this facility is greater than 50 miles from the hospital. IP CM is continuing to follow.    Expected Discharge Plan: Skilled Nursing Facility Barriers to Discharge: Continued Medical Work up               Expected Discharge Plan and Services                                               Social Drivers of Health (SDOH) Interventions SDOH Screenings   Food Insecurity: No Food Insecurity (12/13/2023)  Housing: Low Risk  (12/13/2023)  Transportation Needs: No Transportation Needs (12/13/2023)  Utilities: Not At Risk (12/13/2023)  Social Connections: Socially Isolated (12/13/2023)  Tobacco Use: Low Risk  (12/12/2023)    Readmission Risk Interventions    12/14/2023    1:44 PM  Readmission Risk Prevention Plan  Post Dischage Appt Complete  Medication Screening Complete  Transportation Screening Complete

## 2023-12-17 NOTE — TOC Progression Note (Signed)
 Transition of Care [facility]) - Progression Note    Patient Details  Name: Lisa Ward MRN: [id number] Date of Birth: 15-Dec-1945  Transition of Care [facility]) CM/SW Contact  Alfonse JONELLE Rex, RN Phone Number: 12/17/2023, 11:16 AM  Clinical Narrative:  Call to patient's son,  Zell, he confirmed he located patient's Social Security Card, need SSN to fax to [place] to confirm patient's SSN as patient's SSN is pulling up a different person in [place]. NCM email and fax number provided.     Expected Discharge Plan: Skilled Nursing Facility Barriers to Discharge: Continued Medical Work up               Expected Discharge Plan and Services                                               Social Drivers of Health (SDOH) Interventions SDOH Screenings   Food Insecurity: No Food Insecurity (12/13/2023)  Housing: Low Risk  (12/13/2023)  Transportation Needs: No Transportation Needs (12/13/2023)  Utilities: Not At Risk (12/13/2023)  Social Connections: Socially Isolated (12/13/2023)  Tobacco Use: Low Risk  (12/12/2023)    Readmission Risk Interventions    12/14/2023    1:44 PM  Readmission Risk Prevention Plan  Post Dischage Appt Complete  Medication Screening Complete  Transportation Screening Complete

## 2023-12-17 NOTE — Assessment & Plan Note (Deleted)
 Reported hitting her head on the car door and then driving home [place] after arriving home Down 2-3 days prior to family finding her Moderate dehydration, hypercalcemia on presentation - resolved with hydration Skeletal survey negative Adequate pain management with tramadol , bowel regimen PT/OT consulted, recommending STR in SNF CK levels trending down and now patient is able to eat/drink She is medically stable for dc once a bed is available

## 2023-12-17 NOTE — Assessment & Plan Note (Signed)
 DNR confirmed at the time of admission and again today Patient will need a gold out of facility DNR form at the time of discharge

## 2023-12-17 NOTE — Assessment & Plan Note (Deleted)
 DNR confirmed at the time of admission and again today Patient will need a gold out of facility DNR form at the time of discharge

## 2023-12-17 NOTE — Assessment & Plan Note (Deleted)
 Continue to hold statin until outpatient follow-up.

## 2023-12-18 DIAGNOSIS — M6282 Rhabdomyolysis: Secondary | ICD-10-CM | POA: Diagnosis not present

## 2023-12-18 LAB — COMPREHENSIVE METABOLIC PANEL WITH GFR
ALT: 23 U/L (ref 0–44)
AST: 22 U/L (ref 15–41)
Albumin: 3.3 g/dL — ABNORMAL LOW (ref 3.5–5.0)
Alkaline Phosphatase: 70 U/L (ref 38–126)
Anion gap: 10 (ref 5–15)
BUN: 8 mg/dL (ref 8–23)
CO2: 27 mmol/L (ref 22–32)
Calcium: 9.3 mg/dL (ref 8.9–10.3)
Chloride: 104 mmol/L (ref 98–111)
Creatinine, Ser: 0.42 mg/dL — ABNORMAL LOW (ref 0.44–1.00)
GFR, Estimated: >60 mL/min (ref >60)
Glucose, Bld: 109 mg/dL — ABNORMAL HIGH (ref 70–99)
Potassium: 3.6 mmol/L (ref 3.5–5.1)
Sodium: 140 mmol/L (ref 135–145)
Total Bilirubin: 0.9 mg/dL (ref 0.0–1.2)
Total Protein: 6.4 g/dL — ABNORMAL LOW (ref 6.5–8.1)

## 2023-12-18 LAB — CBC
HCT: 40.1 % (ref 36.0–46.0)
Hemoglobin: 13.3 g/dL (ref 12.0–15.0)
MCH: 30.4 pg (ref 26.0–34.0)
MCHC: 33.2 g/dL (ref 30.0–36.0)
MCV: 91.6 fL (ref 80.0–100.0)
Platelets: 278 K/uL (ref 150–400)
RBC: 4.38 MIL/uL (ref 3.87–5.11)
RDW: 14.3 % (ref 11.5–15.5)
WBC: 11.5 K/uL — ABNORMAL HIGH (ref 4.0–10.5)
nRBC: 0 % (ref 0.0–0.2)

## 2023-12-18 NOTE — Progress Notes (Signed)
 Physical Therapy Treatment Patient Details Name: Lisa Ward MRN: [id number] DOB: Mar 05, 1945 Today's Date: 12/18/2023   History of Present Illness Patient is an 78 year old female who presented to the ED 12/12/23 for evaluation after a fall at home and prolonged time down after hitting her head on car door.  Concern right fibular head Fx; ortho consult pending.  Dx with rhabdomyolysis & UTI.  PMHx includes HTN, HLD, vertigo (per pt), appendectomy, cholecystectomy, hysterectomy, OA, osteoporosis, right THA, right TKA    PT Comments  Pt is slowly progressing toward acute PT goals this session with decreased assist required for bed mobility and able to tolerate performance of transfers with use of sara stedy. Pt performed bed mobility with MOD A+2 and improved LE initiation/mobility and sit to stand transfers with MAX A+2 and use of sara stedy- remains limited by pain in L LE during mobility. Pt will benefit from continued skilled PT to increase their independence and maximize safety with mobility.      If plan is discharge home, recommend the following: Two people to help with walking and/or transfers;A lot of help with bathing/dressing/bathroom;Assist for transportation;Help with stairs or ramp for entrance;Assistance with cooking/housework   Can travel by private vehicle     No  Equipment Recommendations  Other (comment) (defer to next level of care)    Recommendations for Other Services       Precautions / Restrictions Precautions Precautions: Fall Restrictions Weight Bearing Restrictions Per Provider Order: No     Mobility  Bed Mobility Overal bed mobility: Needs Assistance Bed Mobility: Supine to Sit     Supine to sit: Mod assist, +2 for physical assistance, +2 for safety/equipment, Used rails, HOB elevated     General bed mobility comments: improved initiaiton of B LEs to EOB and use of bed rails, assist for trunk to upright and completion of LEs fully to EOB.  Assist with chuck pad to scoot hips in prep for sit to stand transfers.  Episode of dizziness upon initial sitting- pt with history of vertigio. Improved during session.    Transfers Overall transfer level: Needs assistance Equipment used:  (stedy) Transfers: Sit to/from Stand Sit to Stand: Max assist, +2 physical assistance, +2 safety/equipment           General transfer comment: trial from EOB and from recliner surface with MAX A+2. Cues for hand placement, upright posture/hip extension in standing. Transfer via Lift Equipment: Stedy  Ambulation/Gait                   Stairs             Wheelchair Mobility     Tilt Bed    Modified Rankin (Stroke Patients Only)       Balance Overall balance assessment: Needs assistance, History of Falls Sitting-balance support: Single extremity supported, Feet supported Sitting balance-Leahy Scale: Fair Sitting balance - Comments: CGA-supervision with use of singel UE support on bed rail   Standing balance support: Bilateral upper extremity supported, During functional activity, Reliant on assistive device for balance Standing balance-Leahy Scale: Poor                              Communication Communication Communication: No apparent difficulties  Cognition Arousal: Alert Behavior During Therapy: WFL for tasks assessed/performed   PT - Cognitive impairments: No apparent impairments  Following commands: Intact      Cueing    Exercises      General Comments        Pertinent Vitals/Pain Pain Assessment Pain Assessment: Faces Faces Pain Scale: Hurts even more Pain Location: L knee down to ankle Pain Descriptors / Indicators: Throbbing Pain Intervention(s): Limited activity within patient's tolerance, Monitored during session, Premedicated before session, Repositioned, Ice applied    Home Living                          Prior Function             PT Goals (current goals can now be found in the care plan section) Acute Rehab PT Goals Patient Stated Goal: to walk PT Goal Formulation: With patient Time For Goal Achievement: 12/27/23 Potential to Achieve Goals: Fair Progress towards PT goals: Progressing toward goals    Frequency    Min 2X/week      PT Plan      Co-evaluation              AM-PAC PT 6 Clicks Mobility   Outcome Measure  Help needed turning from your back to your side while in a flat bed without using bedrails?: A Lot Help needed moving from lying on your back to sitting on the side of a flat bed without using bedrails?: Total Help needed moving to and from a bed to a chair (including a wheelchair)?: Total Help needed standing up from a chair using your arms (e.g., wheelchair or bedside chair)?: Total Help needed to walk in hospital room?: Total Help needed climbing 3-5 steps with a railing? : Total 6 Click Score: 7    End of Session Equipment Utilized During Treatment: Gait belt Activity Tolerance: Patient tolerated treatment well Patient left: in chair;with call bell/phone within reach;with chair alarm set Nurse Communication: Mobility status PT Visit Diagnosis: Unsteadiness on feet (R26.81);History of falling (Z91.81);Muscle weakness (generalized) (M62.81);Pain;Difficulty in walking, not elsewhere classified (R26.2) Pain - Right/Left: Left Pain - part of body: Leg;Knee     Time: 8678-8655 PT Time Calculation (min) (ACUTE ONLY): 23 min  Charges:    $Therapeutic Activity: 23-37 mins PT General Charges $$ ACUTE PT VISIT: 1 Visit                    Tinnie BERRY PT, DPT  Acute Rehabilitation Services  Office [phone]  12/18/2023, 2:05 PM

## 2023-12-18 NOTE — Assessment & Plan Note (Signed)
 Body mass index is 30.8 kg/m.SABRA  Weight loss should be encouraged Outpatient PCP/bariatric medicine f/u encouraged Significantly low or high BMI is associated with higher medical risk including morbidity and mortality

## 2023-12-18 NOTE — Assessment & Plan Note (Deleted)
 Body mass index is 30.8 kg/m.SABRA  Weight loss should be encouraged Outpatient PCP/bariatric medicine f/u encouraged Significantly low or high BMI is associated with higher medical risk including morbidity and mortality

## 2023-12-18 NOTE — TOC Progression Note (Signed)
 Transition of Care [facility]) - Progression Note    Patient Details  Name: KARMA ANSLEY MRN: [id number] Date of Birth: November 18, 1945  Transition of Care [facility]) CM/SW Contact  NORMAN ASPEN, LCSW Phone Number: 12/18/2023, 3:30 PM  Clinical Narrative:     Have secured insurance authorization for pt admission to [facility] SNF in [place], however, family has now decided to pursue SNF bed locally due to cost of ambulance transport to [place].  Will reach out to local facilities who had offered bed to confirm and then review with family for choice.  Expected Discharge Plan: Skilled Nursing Facility Barriers to Discharge: Continued Medical Work up               Expected Discharge Plan and Services                                               Social Drivers of Health (SDOH) Interventions SDOH Screenings   Food Insecurity: No Food Insecurity (12/13/2023)  Housing: Low Risk  (12/13/2023)  Transportation Needs: No Transportation Needs (12/13/2023)  Utilities: Not At Risk (12/13/2023)  Social Connections: Socially Isolated (12/13/2023)  Tobacco Use: Low Risk  (12/12/2023)    Readmission Risk Interventions    12/14/2023    1:44 PM  Readmission Risk Prevention Plan  Post Dischage Appt Complete  Medication Screening Complete  Transportation Screening Complete

## 2023-12-18 NOTE — Assessment & Plan Note (Signed)
 DNR confirmed at the time of admission and again today Patient will need a gold out of facility DNR form at the time of discharge

## 2023-12-18 NOTE — Assessment & Plan Note (Deleted)
 Holding olmesartan Will resume based on current BP 156/69, irbesartan per formulary

## 2023-12-18 NOTE — Progress Notes (Signed)
 Progress Note   Patient: Lisa Ward FMW:[id number] DOB: 1945/06/01 DOA: 12/12/2023     5 DOS: the patient was seen and examined on 12/18/2023   Brief hospital course: Lisa Ward is a 78 y.o. female with medical history significant for HTN, HLD who is admitted with elevated CK/early rhabdomyolysis after a fall at home with prolonged downtime.  Improved, waiting for STR.   Assessment & Plan Rhabdomyolysis Fall at home, initial encounter Reported hitting her head on the car door and then driving home [place] after arriving home Down 2-3 days prior to family finding her Moderate dehydration, hypercalcemia on presentation - resolved with hydration Skeletal survey negative Adequate pain management with tramadol , bowel regimen PT/OT consulted, recommending STR in SNF CK levels trending down and now patient is able to eat/drink She is medically stable for dc once a bed is available Acute UTI (urinary tract infection) Urine culture with pansensitive E. Coli Completed antibiotics (Ceftriaxone -> cephalexin) HTN (hypertension) Holding olmesartan Will resume based on current BP 156/69, irbesartan per formulary HLD (hyperlipidemia) Continue to hold statin until outpatient follow-up.  Pressure injury of skin Wound 12/12/23 2357 Pressure Injury Sacrum Left Stage 2 -  Partial thickness loss of dermis presenting as a shallow open injury with a red, pink wound bed without slough. (Active)  POA Class 1 obesity due to excess calories with body mass index (BMI) of 30.0 to 30.9 in adult Body mass index is 30.8 kg/m.Lisa Ward  Weight loss should be encouraged Outpatient PCP/bariatric medicine f/u encouraged Significantly low or high BMI is associated with higher medical risk including morbidity and mortality  DNR (do not resuscitate) DNR confirmed at the time of admission and again today Patient will need a gold out of facility DNR form at the time of discharge        Consultants: PT OT TOC team   Procedures: None   Antibiotics: Ceftriaxone 11/5-8 Cephalexin 11/8-9  30 Day Unplanned Readmission Risk Score    Flowsheet Row ED to Hosp-Admission (Current) from 12/12/2023 in [facility] [place] [facility]  30 Day Unplanned Readmission Risk Score (%) 9.45 Filed at 12/18/2023 1200    This score is the patient's risk of an unplanned readmission within 30 days of being discharged (0 -100%). The score is based on dignosis, age, lab data, medications, orders, and past utilization.   Low:  0-14.9   Medium: 15-21.9   High: 22-29.9   Extreme: 30 and above           Subjective: Feeling ok, no specific complaints.  Wants to go to rehab when possible.   Objective: Vitals:   12/18/23 0443 12/18/23 1400  BP: (!) 173/82 137/68  Pulse: 65 86  Resp: 18 16  Temp: 98.3 F (36.8 C) 99.2 F (37.3 C)  SpO2: 100% 96%    Intake/Output Summary (Last 24 hours) at 12/18/2023 1453 Last data filed at 12/18/2023 0945 Gross per 24 hour  Intake 890 ml  Output 1600 ml  Net -710 ml   Filed Weights   12/12/23 2352  Weight: 81.4 kg    Exam:  General:  Appears calm and comfortable and is in NAD Eyes:  normal lids, iris ENT:  grossly normal hearing, lips & tongue, mmm Cardiovascular:  RRR. No LE edema.  Respiratory:   CTA bilaterally with no wheezes/rales/rhonchi.  Normal respiratory effort. Abdomen:  soft, NT, ND Skin:  no rash or induration seen on limited exam Musculoskeletal:  generalized weakness Psychiatric:  grossly normal mood and affect,  speech fluent and appropriate, AOx3 Neurologic:  CN 2-12 grossly intact, moves all extremities in coordinated fashion  Data Reviewed: I have reviewed the patient's lab results since admission.  Pertinent labs for today include:   Unremarkable CMP WBC 11.5     Family Communication: None present  Mobility: PT/OT Consulted and are recommending - Skilled Nursing-Short Term Rehab (<3  Hours/Day)12/18/2023 1327    Code Status: Limited: Do not attempt resuscitation (DNR) -DNR-LIMITED -Do Not Intubate/DNI    Disposition: Status is: Inpatient Remains inpatient appropriate because: awaiting placement     Time spent: 35 minutes  Unresulted Labs (From admission, onward)    None        Author: Delon Herald, MD 12/18/2023 2:53 PM  For on call review [email or web address].

## 2023-12-18 NOTE — Assessment & Plan Note (Deleted)
 Wound 12/12/23 2357 Pressure Injury Sacrum Left Stage 2 -  Partial thickness loss of dermis presenting as a shallow open injury with a red, pink wound bed without slough. (Active)  POA

## 2023-12-18 NOTE — Assessment & Plan Note (Deleted)
 DNR confirmed at the time of admission and again today Patient will need a gold out of facility DNR form at the time of discharge

## 2023-12-18 NOTE — Assessment & Plan Note (Signed)
 Holding olmesartan Will resume based on current BP 156/69, irbesartan per formulary

## 2023-12-18 NOTE — Assessment & Plan Note (Signed)
 Reported hitting her head on the car door and then driving home [place] after arriving home Down 2-3 days prior to family finding her Moderate dehydration, hypercalcemia on presentation - resolved with hydration Skeletal survey negative Adequate pain management with tramadol , bowel regimen PT/OT consulted, recommending STR in SNF CK levels trending down and now patient is able to eat/drink She is medically stable for dc once a bed is available

## 2023-12-18 NOTE — Assessment & Plan Note (Deleted)
 Continue to hold statin until outpatient follow-up.

## 2023-12-18 NOTE — Assessment & Plan Note (Signed)
 Wound 12/12/23 2357 Pressure Injury Sacrum Left Stage 2 -  Partial thickness loss of dermis presenting as a shallow open injury with a red, pink wound bed without slough. (Active)  POA

## 2023-12-18 NOTE — Assessment & Plan Note (Deleted)
 Reported hitting her head on the car door and then driving home [place] after arriving home Down 2-3 days prior to family finding her Moderate dehydration, hypercalcemia on presentation - resolved with hydration Skeletal survey negative Adequate pain management with tramadol , bowel regimen PT/OT consulted, recommending STR in SNF CK levels trending down and now patient is able to eat/drink She is medically stable for dc once a bed is available

## 2023-12-18 NOTE — Plan of Care (Signed)
 ?  Problem: Clinical Measurements: ?Goal: Ability to maintain clinical measurements within normal limits will improve ?Outcome: Progressing ?Goal: Will remain free from infection ?Outcome: Progressing ?Goal: Diagnostic test results will improve ?Outcome: Progressing ?  ?

## 2023-12-18 NOTE — Plan of Care (Signed)

## 2023-12-18 NOTE — Assessment & Plan Note (Signed)
 Urine culture with pansensitive E. Coli Completed antibiotics (Ceftriaxone -> cephalexin)

## 2023-12-18 NOTE — Assessment & Plan Note (Signed)
 Continue to hold statin until outpatient follow-up.

## 2023-12-18 NOTE — Assessment & Plan Note (Deleted)
 Urine culture with pansensitive E. Coli Completed antibiotics (Ceftriaxone -> cephalexin)

## 2023-12-19 DIAGNOSIS — N390 Urinary tract infection, site not specified: Secondary | ICD-10-CM

## 2023-12-19 DIAGNOSIS — Z7401 Bed confinement status: Secondary | ICD-10-CM | POA: Diagnosis not present

## 2023-12-19 DIAGNOSIS — I10 Essential (primary) hypertension: Secondary | ICD-10-CM

## 2023-12-19 DIAGNOSIS — Z6830 Body mass index (BMI) 30.0-30.9, adult: Secondary | ICD-10-CM | POA: Diagnosis not present

## 2023-12-19 DIAGNOSIS — E6609 Other obesity due to excess calories: Secondary | ICD-10-CM

## 2023-12-19 DIAGNOSIS — Z66 Do not resuscitate: Secondary | ICD-10-CM | POA: Diagnosis not present

## 2023-12-19 DIAGNOSIS — N179 Acute kidney failure, unspecified: Secondary | ICD-10-CM | POA: Diagnosis not present

## 2023-12-19 DIAGNOSIS — K219 Gastro-esophageal reflux disease without esophagitis: Secondary | ICD-10-CM | POA: Diagnosis not present

## 2023-12-19 DIAGNOSIS — M25562 Pain in left knee: Secondary | ICD-10-CM | POA: Diagnosis not present

## 2023-12-19 DIAGNOSIS — R2689 Other abnormalities of gait and mobility: Secondary | ICD-10-CM | POA: Diagnosis not present

## 2023-12-19 DIAGNOSIS — Y92009 Unspecified place in unspecified non-institutional (private) residence as the place of occurrence of the external cause: Secondary | ICD-10-CM | POA: Diagnosis not present

## 2023-12-19 DIAGNOSIS — I739 Peripheral vascular disease, unspecified: Secondary | ICD-10-CM | POA: Diagnosis not present

## 2023-12-19 DIAGNOSIS — Z9181 History of falling: Secondary | ICD-10-CM | POA: Diagnosis not present

## 2023-12-19 DIAGNOSIS — R531 Weakness: Secondary | ICD-10-CM | POA: Diagnosis not present

## 2023-12-19 DIAGNOSIS — E66811 Obesity, class 1: Secondary | ICD-10-CM

## 2023-12-19 DIAGNOSIS — M6282 Rhabdomyolysis: Secondary | ICD-10-CM | POA: Diagnosis not present

## 2023-12-19 DIAGNOSIS — W19XXXA Unspecified fall, initial encounter: Secondary | ICD-10-CM | POA: Diagnosis not present

## 2023-12-19 DIAGNOSIS — E669 Obesity, unspecified: Secondary | ICD-10-CM | POA: Diagnosis not present

## 2023-12-19 DIAGNOSIS — M169 Osteoarthritis of hip, unspecified: Secondary | ICD-10-CM | POA: Diagnosis not present

## 2023-12-19 DIAGNOSIS — M25561 Pain in right knee: Secondary | ICD-10-CM | POA: Diagnosis not present

## 2023-12-19 DIAGNOSIS — L89152 Pressure ulcer of sacral region, stage 2: Secondary | ICD-10-CM

## 2023-12-19 DIAGNOSIS — E876 Hypokalemia: Secondary | ICD-10-CM | POA: Diagnosis not present

## 2023-12-19 DIAGNOSIS — M25512 Pain in left shoulder: Secondary | ICD-10-CM | POA: Diagnosis not present

## 2023-12-19 DIAGNOSIS — L89320 Pressure ulcer of left buttock, unstageable: Secondary | ICD-10-CM | POA: Diagnosis not present

## 2023-12-19 DIAGNOSIS — R42 Dizziness and giddiness: Secondary | ICD-10-CM | POA: Diagnosis not present

## 2023-12-19 DIAGNOSIS — R5381 Other malaise: Secondary | ICD-10-CM | POA: Diagnosis not present

## 2023-12-19 DIAGNOSIS — L8990 Pressure ulcer of unspecified site, unspecified stage: Secondary | ICD-10-CM | POA: Diagnosis not present

## 2023-12-19 DIAGNOSIS — M6281 Muscle weakness (generalized): Secondary | ICD-10-CM | POA: Diagnosis not present

## 2023-12-19 DIAGNOSIS — E785 Hyperlipidemia, unspecified: Secondary | ICD-10-CM | POA: Diagnosis not present

## 2023-12-19 LAB — CK: Total CK: 27 U/L — ABNORMAL LOW (ref 38–234)

## 2023-12-19 MED ORDER — ACETAMINOPHEN 325 MG PO TABS: 650.0000 mg | ORAL_TABLET | Freq: Four times a day (QID) | ORAL | Status: AC | PRN

## 2023-12-19 MED ORDER — SENNOSIDES-DOCUSATE SODIUM 8.6-50 MG PO TABS: 1.0000 | ORAL_TABLET | Freq: Two times a day (BID) | ORAL | Status: AC | PRN

## 2023-12-19 NOTE — Progress Notes (Signed)
 Pt report and DC instructions were given to [place] at [facility].Pt transports via [facility].

## 2023-12-19 NOTE — Plan of Care (Signed)
  Problem: Pain Managment: Goal: General experience of comfort will improve and/or be controlled Outcome: Progressing

## 2023-12-19 NOTE — Plan of Care (Signed)

## 2023-12-19 NOTE — Discharge Summary (Addendum)
 Physician Discharge Summary  Lisa Ward FMW:[id number] DOB: 06/17/1945 DOA: 12/12/2023  PCP: Claudene Pellet, MD  Admit date: 12/12/2023 Discharge date: 12/19/23  Admitted From: Home Disposition: SNF Recommendations for Outpatient Follow-up:  Check CBC and CMP in 1 week Please follow up on the following pending results: None   Discharge Condition: Stable CODE STATUS: DNR Diet Orders (From admission, onward)     Start     Ordered   12/12/23 2314  Diet Heart Fluid consistency: Thin  Diet effective now       Question:  Fluid consistency:  Answer:  Thin   12/12/23 2313              Hospital course 78 y.o. female with PMH of HTN, HLD and obesity who is admitted with elevated CK/early rhabdomyolysis after a fall at home with prolonged downtime.  In ED, stable vitals.  AST 63.  Calcium  10.9.  Otherwise, CMP without significant finding.  CK was elevated to 898.  WBC 31 with left shift.  UA with small LE, many bacteria and > 300 protein.  Urine culture obtained.  Started on IV fluid and IV ceftriaxone.  Urine culture with pansensitive E. Coli.  Antibiotic de-escalated to Keflex and she completed antibiotic course in house.  Hypercalcemia, rhabdomyolysis and leukocytosis resolved.  Therapy recommended SNF.  See individual problem list below for more.   Problems addressed during this hospitalization Principal Problem:   Rhabdomyolysis Active Problems:   Fall at home, initial encounter   Hypercalcemia   HTN (hypertension)   HLD (hyperlipidemia)   Acute UTI (urinary tract infection)   Pressure injury of skin   Class 1 obesity due to excess calories with body mass index (BMI) of 30.0 to 30.9 in adult   DNR (do not resuscitate)    Body mass index is 30.8 kg/m.       Pressure skin injury: Present on admission. Wound 12/12/23 2357 Pressure Injury Sacrum Left Stage 2 -  Partial thickness loss of dermis presenting as a shallow open injury with a red, pink wound bed  without slough. (Active)  - See wound care instruction below  Consultations: None  Time spent 35  minutes  Vital signs Vitals:   12/18/23 0443 12/18/23 1400 12/18/23 2039 12/19/23 0545  BP: (!) 173/82 137/68 (!) 147/71 102/86  Pulse: 65 86 75 70  Temp: 98.3 F (36.8 C) 99.2 F (37.3 C) 98.4 F (36.9 C) 98 F (36.7 C)  Resp: 18 16 19 18   Height:      Weight:      SpO2: 100% 96% 93% 97%  TempSrc: Oral Oral Oral   BMI (Calculated):         Discharge exam  GENERAL: No apparent distress.  Nontoxic. HEENT: MMM.  Vision and hearing grossly intact.  NECK: Supple.  No apparent JVD.  RESP:  No IWOB.  Fair aeration bilaterally. CVS:  RRR. Heart sounds normal.  ABD/GI/GU: BS+. Abd soft, NTND.  MSK/EXT:  Moves extremities but weak in both legs. No apparent deformity.  Trace BLE edema. SKIN: Stage II left sacral decubitus NEURO: Awake and alert. Oriented appropriately.  No apparent focal neuro deficit.  Symmetric BLE weakness PSYCH: Calm. Normal affect.   Discharge Instructions Discharge Instructions     Discharge wound care:   Complete by: As directed    Foam dressing twice a week. Change if soiled as needed   Increase activity slowly   Complete by: As directed    No wound care  Complete by: As directed       Allergies as of 12/19/2023   No Known Allergies      Medication List     PAUSE taking these medications    atorvastatin  40 MG tablet Wait to take this until: December 26, 2023 Commonly known as: LIPITOR Take 40 mg by mouth at bedtime.       STOP taking these medications    omeprazole 40 MG capsule Commonly known as: PRILOSEC   traMADol  50 MG tablet Commonly known as: ULTRAM        TAKE these medications    acetaminophen  325 MG tablet Commonly known as: TYLENOL  Take 2 tablets (650 mg total) by mouth every 6 (six) hours as needed for mild pain (pain score 1-3) or headache (or Fever >/= 101).   olmesartan 20 MG tablet Commonly known as:  BENICAR Take 20 mg by mouth daily.   potassium chloride  8 MEQ tablet Commonly known as: KLOR-CON  Take 24 mEq by mouth daily.   senna-docusate 8.6-50 MG tablet Commonly known as: Senokot-S Take 1 tablet by mouth 2 (two) times daily between meals as needed for mild constipation.               Discharge Care Instructions  (From admission, onward)           Start     Ordered   12/19/23 0000  Discharge wound care:       Comments: Foam dressing twice a week. Change if soiled as needed   12/19/23 1045             Procedures/Studies:   CT KNEE RIGHT WO CONTRAST Result Date: 12/14/2023 EXAM: CT RIGHT KNEE, WITHOUT IV CONTRAST 12/14/2023 11:12:22 AM TECHNIQUE: Axial images were acquired through the right knee without IV contrast. Reformatted images were reviewed. Automated exposure control, iterative reconstruction, and/or weight based adjustment of the mA/kV was utilized to reduce the radiation dose to as low as reasonably achievable. COMPARISON: None provided. CLINICAL HISTORY: Knee trauma, tibial plateau fracture. FINDINGS: BONES: Right total knee prosthesis. Mild spurring of the proximal fibula. No discrete fracture is readily apparent. JOINTS: No dislocation. No knee effusion. SOFT TISSUES: Prominent semimembranosus muscular atrophy as on image 10 series 7. IMPRESSION: 1. No definite fracture identified. No knee effusion. 2. Right total knee prosthesis. 3. Mild degenerative spurring at the proximal fibula. 4. Prominent semimembranosus muscular atrophy. Electronically signed by: Ryan Salvage MD 12/14/2023 11:44 AM EST RP Workstation: [id number]   DG Hand Complete Left Result Date: 12/12/2023 CLINICAL DATA:  Pain, found down EXAM: LEFT HAND - COMPLETE 3+ VIEW COMPARISON:  None Available. FINDINGS: Frontal, oblique, and lateral views of the left hand are obtained. There are no acute displaced fractures. There is severe diffuse joint space narrowing throughout the hand and  wrist compatible with osteoarthritis. Central erosions throughout the interphalangeal joints may reflect erosive osteoarthritis. Soft tissue swelling of the second and third digits. IMPRESSION: 1. No acute displaced fracture. 2. Severe multifocal osteoarthritis. Electronically Signed   By: Ozell Daring M.D.   On: 12/12/2023 22:26   CT CERVICAL SPINE WO CONTRAST Result Date: 12/12/2023 CLINICAL DATA:  Found down, pain, headache EXAM: CT CERVICAL SPINE WITHOUT CONTRAST TECHNIQUE: Multidetector CT imaging of the cervical spine was performed without intravenous contrast. Multiplanar CT image reconstructions were also generated. RADIATION DOSE REDUCTION: This exam was performed according to the departmental dose-optimization program which includes automated exposure control, adjustment of the mA and/or kV according to patient size and/or use of  iterative reconstruction technique. COMPARISON:  None Available. FINDINGS: Alignment: Mild degenerative anterolisthesis of C3 on C4. Otherwise alignment is anatomic. Skull base and vertebrae: No acute fracture. No primary bone lesion or focal pathologic process. Soft tissues and spinal canal: No prevertebral fluid or swelling. No visible canal hematoma. Disc levels: Severe spondylosis at C4-5, C5-6, and C6-7. Diffuse spondylosis greatest from C2-3 through C4-5. Upper chest: Airway is patent. Dependent areas of consolidation are seen bilaterally at the lung apices, likely atelectasis. Central airways are patent. Other: Reconstructed images demonstrate no additional findings. IMPRESSION: 1. No acute cervical spine fracture. 2. Extensive multilevel cervical degenerative changes. Electronically Signed   By: Ozell Daring M.D.   On: 12/12/2023 22:01   CT HEAD WO CONTRAST Result Date: 12/12/2023 CLINICAL DATA:  Found down, headache EXAM: CT HEAD WITHOUT CONTRAST TECHNIQUE: Contiguous axial images were obtained from the base of the skull through the vertex without intravenous  contrast. RADIATION DOSE REDUCTION: This exam was performed according to the departmental dose-optimization program which includes automated exposure control, adjustment of the mA and/or kV according to patient size and/or use of iterative reconstruction technique. COMPARISON:  07/22/2003 FINDINGS: Brain: No acute infarct or hemorrhage. Lateral ventricles and midline structures are unremarkable. No acute extra-axial fluid collections. No mass effect. Vascular: No hyperdense vessel or unexpected calcification. Skull: Scalp edema along the bilateral convexities. No underlying fractures. Remainder of the calvarium is unremarkable. Sinuses/Orbits: No acute finding. Other: None. IMPRESSION: 1. No acute intracranial process. 2. Scalp edema along the bilateral convexities consistent with history of fall. Electronically Signed   By: Ozell Daring M.D.   On: 12/12/2023 21:59   DG Sacrum/Coccyx Result Date: 12/12/2023 CLINICAL DATA:  Fall EXAM: SACRUM AND COCCYX - 2+ VIEW COMPARISON:  09/11/2018 FINDINGS: Right hip replacement with intact hardware normal alignment. Non widened SI joints. Intact pubic symphysis. Limited due to technique and osteopenia. No definitive fracture IMPRESSION: Limited due to technique and osteopenia. No definitive fracture. Electronically Signed   By: Luke Bun M.D.   On: 12/12/2023 21:58   DG Chest 1 View Result Date: 12/12/2023 CLINICAL DATA:  Fall EXAM: CHEST  1 VIEW COMPARISON:  10/26/2005 FINDINGS: Cardiomegaly with aortic atherosclerosis. Perihilar interstitial opacities suggestive of airways thickening or inflammation. Negative for pleural effusion or pneumothorax. IMPRESSION: Cardiomegaly with perihilar interstitial opacities suggestive of airways thickening or inflammation. Electronically Signed   By: Luke Bun M.D.   On: 12/12/2023 21:58   DG Knee Complete 4 Views Right Result Date: 12/12/2023 CLINICAL DATA:  Fall EXAM: RIGHT KNEE - COMPLETE 4+ VIEW COMPARISON:  None  Available. FINDINGS: Right knee replacement with intact hardware and normal alignment. No sizable effusion. Findings questionable for subtle nondisplaced fibular head fracture. IMPRESSION: Right knee replacement with intact hardware and normal alignment. Findings questionable for subtle nondisplaced fibular head fracture, correlate for point tenderness. Electronically Signed   By: Luke Bun M.D.   On: 12/12/2023 21:56   DG Knee Complete 4 Views Left Result Date: 12/12/2023 CLINICAL DATA:  Fall EXAM: LEFT KNEE - COMPLETE 4+ VIEW COMPARISON:  None Available. FINDINGS: No definitive fracture or malalignment. Severe tricompartment arthritis of the knee with probable trace effusion. Chondrocalcinosis. IMPRESSION: No acute osseous abnormality. Severe tricompartment arthritis of the knee. Electronically Signed   By: Luke Bun M.D.   On: 12/12/2023 21:55   DG HIPS BILAT WITH PELVIS 3-4 VIEWS Result Date: 12/12/2023 CLINICAL DATA:  Found on floor EXAM: DG HIP (WITH OR WITHOUT PELVIS) 3-4V BILAT COMPARISON:  None Available.  FINDINGS: SI joints are non widened. Pubic symphysis and rami appear intact. Right hip replacement with normal alignment. No definitive fracture. Moderate left hip degenerative change IMPRESSION: Right hip replacement with normal alignment. No definitive fracture. Electronically Signed   By: Luke Bun M.D.   On: 12/12/2023 21:54       The results of significant diagnostics from this hospitalization (including imaging, microbiology, ancillary and laboratory) are listed below for reference.     Microbiology: Recent Results (from the past 240 hours)  Urine Culture (for pregnant, neutropenic or urologic patients or patients with an indwelling urinary catheter)     Status: Abnormal   Collection Time: 12/12/23  8:13 PM   Specimen: Urine, Clean Catch  Result Value Ref Range Status   Specimen Description   Final    URINE, CLEAN CATCH Performed at [facility],  [address], [place], [place] [address]    Special Requests   Final    NONE Performed at [facility], [address] [place], [place] [address]    Culture >=100,000 COLONIES/mL ESCHERICHIA COLI (A)  Final   Report Status 12/15/2023 FINAL  Final   Organism ID, Bacteria ESCHERICHIA COLI (A)  Final      Susceptibility   Escherichia coli - MIC*    AMPICILLIN <=2 SENSITIVE Sensitive     CEFAZOLIN  (URINE) Value in next row Sensitive      <=1 SENSITIVEThis is a modified FDA-approved test that has been validated and its performance characteristics determined by the reporting laboratory.  This laboratory is certified under the Clinical Laboratory Improvement Amendments CLIA as qualified to perform high complexity clinical laboratory testing.    CEFEPIME Value in next row Sensitive      <=1 SENSITIVEThis is a modified FDA-approved test that has been validated and its performance characteristics determined by the reporting laboratory.  This laboratory is certified under the Clinical Laboratory Improvement Amendments CLIA as qualified to perform high complexity clinical laboratory testing.    ERTAPENEM Value in next row Sensitive      <=1 SENSITIVEThis is a modified FDA-approved test that has been validated and its performance characteristics determined by the reporting laboratory.  This laboratory is certified under the Clinical Laboratory Improvement Amendments CLIA as qualified to perform high complexity clinical laboratory testing.    CEFTRIAXONE Value in next row Sensitive      <=1 SENSITIVEThis is a modified FDA-approved test that has been validated and its performance characteristics determined by the reporting laboratory.  This laboratory is certified under the Clinical Laboratory Improvement Amendments CLIA as qualified to perform high complexity clinical laboratory testing.    CIPROFLOXACIN Value in next row Sensitive      <=1 SENSITIVEThis is a modified FDA-approved test  that has been validated and its performance characteristics determined by the reporting laboratory.  This laboratory is certified under the Clinical Laboratory Improvement Amendments CLIA as qualified to perform high complexity clinical laboratory testing.    GENTAMICIN Value in next row Sensitive      <=1 SENSITIVEThis is a modified FDA-approved test that has been validated and its performance characteristics determined by the reporting laboratory.  This laboratory is certified under the Clinical Laboratory Improvement Amendments CLIA as qualified to perform high complexity clinical laboratory testing.    NITROFURANTOIN Value in next row Sensitive      <=1 SENSITIVEThis is a modified FDA-approved test that has been validated and its performance characteristics determined by the reporting laboratory.  This laboratory is  certified under the Clinical Laboratory Improvement Amendments CLIA as qualified to perform high complexity clinical laboratory testing.    TRIMETH/SULFA Value in next row Sensitive      <=1 SENSITIVEThis is a modified FDA-approved test that has been validated and its performance characteristics determined by the reporting laboratory.  This laboratory is certified under the Clinical Laboratory Improvement Amendments CLIA as qualified to perform high complexity clinical laboratory testing.    AMPICILLIN/SULBACTAM Value in next row Sensitive      <=1 SENSITIVEThis is a modified FDA-approved test that has been validated and its performance characteristics determined by the reporting laboratory.  This laboratory is certified under the Clinical Laboratory Improvement Amendments CLIA as qualified to perform high complexity clinical laboratory testing.    PIP/TAZO Value in next row Sensitive      <=4 SENSITIVEThis is a modified FDA-approved test that has been validated and its performance characteristics determined by the reporting laboratory.  This laboratory is certified under the Clinical  Laboratory Improvement Amendments CLIA as qualified to perform high complexity clinical laboratory testing.    MEROPENEM Value in next row Sensitive      <=4 SENSITIVEThis is a modified FDA-approved test that has been validated and its performance characteristics determined by the reporting laboratory.  This laboratory is certified under the Clinical Laboratory Improvement Amendments CLIA as qualified to perform high complexity clinical laboratory testing.    * >=100,000 COLONIES/mL ESCHERICHIA COLI     Labs:  CBC: Recent Labs  Lab 12/12/23 2015 12/13/23 0507 12/14/23 0447 12/18/23 0532  WBC 31.0* 22.7* 12.7* 11.5*  NEUTROABS 26.8*  --   --   --   HGB 16.2* 14.0 12.3 13.3  HCT 51.2* 44.5 39.4 40.1  MCV 92.1 93.5 95.4 91.6  PLT 367 280 223 278   BMP &GFR Recent Labs  Lab 12/12/23 2015 12/13/23 0507 12/14/23 0447 12/15/23 0525 12/18/23 0532  NA 141 143 140 139 140  K 3.9 3.2* 3.3* 3.8 3.6  CL 98 103 106 105 104  CO2 28 30 27 24 27   GLUCOSE 135* 113* 93 91 109*  BUN 29* 31* 25* 20 8  CREATININE 0.48 0.47 0.41* 0.40* 0.42*  CALCIUM  10.9* 9.5 9.0 9.2 9.3   Estimated Creatinine Clearance: 57.9 mL/min (A) (by C-G formula based on SCr of 0.42 mg/dL (L)). Liver & Pancreas: Recent Labs  Lab 12/12/23 2015 12/13/23 0507 12/14/23 0447 12/18/23 0532  AST 63* 56* 45* 22  ALT 28 25 24 23   ALKPHOS 86 65 56 70  BILITOT 1.2 0.9 0.7 0.9  PROT 7.7 6.1* 5.6* 6.4*  ALBUMIN  4.2 3.4* 3.1* 3.3*   No results for input(s): LIPASE, AMYLASE in the last 168 hours. No results for input(s): AMMONIA in the last 168 hours. Diabetic: No results for input(s): HGBA1C in the last 72 hours. No results for input(s): GLUCAP in the last 168 hours. Cardiac Enzymes: Recent Labs  Lab 12/12/23 2015 12/13/23 0507 12/14/23 0447 12/19/23 0825  CKTOTAL 898* 957* 440* 27*   No results for input(s): PROBNP in the last 8760 hours. Coagulation Profile: Recent Labs  Lab 12/12/23 2015   INR 0.9   Thyroid  Function Tests: No results for input(s): TSH, T4TOTAL, FREET4, T3FREE, THYROIDAB in the last 72 hours. Lipid Profile: No results for input(s): CHOL, HDL, LDLCALC, TRIG, CHOLHDL, LDLDIRECT in the last 72 hours. Anemia Panel: No results for input(s): VITAMINB12, FOLATE, FERRITIN, TIBC, IRON, RETICCTPCT in the last 72 hours. Urine analysis:    Component Value Date/Time  COLORURINE AMBER (A) 12/12/2023 2013   APPEARANCEUR CLOUDY (A) 12/12/2023 2013   LABSPEC 1.020 12/12/2023 2013   PHURINE 5.0 12/12/2023 2013   GLUCOSEU NEGATIVE 12/12/2023 2013   HGBUR NEGATIVE 12/12/2023 2013   BILIRUBINUR NEGATIVE 12/12/2023 2013   KETONESUR 20 (A) 12/12/2023 2013   PROTEINUR >=300 (A) 12/12/2023 2013   NITRITE NEGATIVE 12/12/2023 2013   LEUKOCYTESUR SMALL (A) 12/12/2023 2013   Sepsis Labs: Invalid input(s): PROCALCITONIN, LACTICIDVEN   SIGNED:  Lucila Klecka T Hazelyn Kallen, MD  [facility] 12/19/2023, 10:45 AM

## 2023-12-19 NOTE — TOC Transition Note (Signed)
 Transition of Care [facility]) - Discharge Note   Patient Details  Name: Lisa Ward MRN: [id number] Date of Birth: Jun 14, 1945  [facility] CM/SW Contact:  NORMAN ASPEN, LCSW Phone Number: 12/19/2023, 11:12 AM   Clinical Narrative:     Reviewed local SNF bed offers with pt/son this morning and they accepted bed at [facility].  Bed ready today and pt is medically cleared for dc.  Have updated insurance auth with new facility information.  PTAR called at 11:10am.  RN to call report to [phone].  No further IP CM needs.  Final next level of care: Skilled Nursing Facility Barriers to Discharge: Barriers Resolved   Patient Goals and CMS Choice Patient states their goals for this hospitalization and ongoing recovery are:: return home          Discharge Placement              Patient chooses bed at: [facility] Patient to be transferred to facility by: PTAR Name of family member notified: son, Zell Patient and family notified of of transfer: 12/19/23  Discharge Plan and Services Additional resources added to the After Visit Summary for                  DME Arranged: N/A DME Agency: NA                  Social Drivers of Health (SDOH) Interventions SDOH Screenings   Food Insecurity: No Food Insecurity (12/13/2023)  Housing: Low Risk  (12/13/2023)  Transportation Needs: No Transportation Needs (12/13/2023)  Utilities: Not At Risk (12/13/2023)  Social Connections: Socially Isolated (12/13/2023)  Tobacco Use: Low Risk  (12/12/2023)     Readmission Risk Interventions    12/14/2023    1:44 PM  Readmission Risk Prevention Plan  Post Dischage Appt Complete  Medication Screening Complete  Transportation Screening Complete

## 2023-12-20 DIAGNOSIS — I10 Essential (primary) hypertension: Secondary | ICD-10-CM | POA: Diagnosis not present

## 2023-12-20 DIAGNOSIS — N179 Acute kidney failure, unspecified: Secondary | ICD-10-CM | POA: Diagnosis not present

## 2023-12-20 DIAGNOSIS — R5381 Other malaise: Secondary | ICD-10-CM | POA: Diagnosis not present

## 2023-12-20 DIAGNOSIS — R42 Dizziness and giddiness: Secondary | ICD-10-CM | POA: Diagnosis not present

## 2023-12-20 DIAGNOSIS — E785 Hyperlipidemia, unspecified: Secondary | ICD-10-CM | POA: Diagnosis not present

## 2023-12-20 DIAGNOSIS — E669 Obesity, unspecified: Secondary | ICD-10-CM | POA: Diagnosis not present

## 2023-12-20 DIAGNOSIS — N390 Urinary tract infection, site not specified: Secondary | ICD-10-CM | POA: Diagnosis not present

## 2023-12-20 DIAGNOSIS — L89320 Pressure ulcer of left buttock, unstageable: Secondary | ICD-10-CM | POA: Diagnosis not present

## 2023-12-20 DIAGNOSIS — M6282 Rhabdomyolysis: Secondary | ICD-10-CM | POA: Diagnosis not present

## 2023-12-21 DIAGNOSIS — N390 Urinary tract infection, site not specified: Secondary | ICD-10-CM | POA: Diagnosis not present

## 2023-12-21 DIAGNOSIS — Z9181 History of falling: Secondary | ICD-10-CM | POA: Diagnosis not present

## 2023-12-21 DIAGNOSIS — M6282 Rhabdomyolysis: Secondary | ICD-10-CM | POA: Diagnosis not present

## 2023-12-21 DIAGNOSIS — M6281 Muscle weakness (generalized): Secondary | ICD-10-CM | POA: Diagnosis not present

## 2023-12-21 DIAGNOSIS — R2689 Other abnormalities of gait and mobility: Secondary | ICD-10-CM | POA: Diagnosis not present

## 2023-12-26 DIAGNOSIS — K219 Gastro-esophageal reflux disease without esophagitis: Secondary | ICD-10-CM | POA: Diagnosis not present

## 2023-12-26 DIAGNOSIS — E876 Hypokalemia: Secondary | ICD-10-CM | POA: Diagnosis not present

## 2023-12-26 DIAGNOSIS — L89320 Pressure ulcer of left buttock, unstageable: Secondary | ICD-10-CM | POA: Diagnosis not present

## 2023-12-26 DIAGNOSIS — R5381 Other malaise: Secondary | ICD-10-CM | POA: Diagnosis not present

## 2023-12-28 DIAGNOSIS — L89320 Pressure ulcer of left buttock, unstageable: Secondary | ICD-10-CM | POA: Diagnosis not present

## 2023-12-28 DIAGNOSIS — I739 Peripheral vascular disease, unspecified: Secondary | ICD-10-CM | POA: Diagnosis not present

## 2023-12-28 DIAGNOSIS — M25561 Pain in right knee: Secondary | ICD-10-CM | POA: Diagnosis not present

## 2023-12-28 DIAGNOSIS — R5381 Other malaise: Secondary | ICD-10-CM | POA: Diagnosis not present

## 2024-01-01 DIAGNOSIS — Z9181 History of falling: Secondary | ICD-10-CM | POA: Diagnosis not present

## 2024-01-01 DIAGNOSIS — M6281 Muscle weakness (generalized): Secondary | ICD-10-CM | POA: Diagnosis not present

## 2024-01-01 DIAGNOSIS — L89320 Pressure ulcer of left buttock, unstageable: Secondary | ICD-10-CM | POA: Diagnosis not present

## 2024-01-01 DIAGNOSIS — N390 Urinary tract infection, site not specified: Secondary | ICD-10-CM | POA: Diagnosis not present

## 2024-01-01 DIAGNOSIS — R2689 Other abnormalities of gait and mobility: Secondary | ICD-10-CM | POA: Diagnosis not present

## 2024-01-01 DIAGNOSIS — M6282 Rhabdomyolysis: Secondary | ICD-10-CM | POA: Diagnosis not present

## 2024-01-08 DIAGNOSIS — M6282 Rhabdomyolysis: Secondary | ICD-10-CM | POA: Diagnosis not present

## 2024-01-08 DIAGNOSIS — Z9181 History of falling: Secondary | ICD-10-CM | POA: Diagnosis not present

## 2024-01-08 DIAGNOSIS — M6281 Muscle weakness (generalized): Secondary | ICD-10-CM | POA: Diagnosis not present

## 2024-01-08 DIAGNOSIS — R2689 Other abnormalities of gait and mobility: Secondary | ICD-10-CM | POA: Diagnosis not present

## 2024-01-08 DIAGNOSIS — N390 Urinary tract infection, site not specified: Secondary | ICD-10-CM | POA: Diagnosis not present

## 2024-01-08 DIAGNOSIS — L89320 Pressure ulcer of left buttock, unstageable: Secondary | ICD-10-CM | POA: Diagnosis not present

## 2024-01-11 ENCOUNTER — Other Ambulatory Visit: Payer: Self-pay | Admitting: *Deleted

## 2024-01-11 DIAGNOSIS — L89320 Pressure ulcer of left buttock, unstageable: Secondary | ICD-10-CM | POA: Diagnosis not present

## 2024-01-11 DIAGNOSIS — M6282 Rhabdomyolysis: Secondary | ICD-10-CM | POA: Diagnosis not present

## 2024-01-11 DIAGNOSIS — Z9181 History of falling: Secondary | ICD-10-CM | POA: Diagnosis not present

## 2024-01-11 DIAGNOSIS — M6281 Muscle weakness (generalized): Secondary | ICD-10-CM | POA: Diagnosis not present

## 2024-01-11 DIAGNOSIS — N390 Urinary tract infection, site not specified: Secondary | ICD-10-CM | POA: Diagnosis not present

## 2024-01-11 DIAGNOSIS — M79606 Pain in leg, unspecified: Secondary | ICD-10-CM

## 2024-01-11 DIAGNOSIS — R2689 Other abnormalities of gait and mobility: Secondary | ICD-10-CM | POA: Diagnosis not present

## 2024-01-15 DIAGNOSIS — M6282 Rhabdomyolysis: Secondary | ICD-10-CM | POA: Diagnosis not present

## 2024-01-15 DIAGNOSIS — R2689 Other abnormalities of gait and mobility: Secondary | ICD-10-CM | POA: Diagnosis not present

## 2024-01-15 DIAGNOSIS — N390 Urinary tract infection, site not specified: Secondary | ICD-10-CM | POA: Diagnosis not present

## 2024-01-15 DIAGNOSIS — M6281 Muscle weakness (generalized): Secondary | ICD-10-CM | POA: Diagnosis not present

## 2024-01-15 DIAGNOSIS — Z9181 History of falling: Secondary | ICD-10-CM | POA: Diagnosis not present

## 2024-01-15 DIAGNOSIS — L89320 Pressure ulcer of left buttock, unstageable: Secondary | ICD-10-CM | POA: Diagnosis not present

## 2024-01-15 NOTE — Progress Notes (Unsigned)
 Office Note     CC: Heel ulceration Requesting Provider:  Claudene Pellet, MD  HPI: Lisa Ward is a 78 y.o. (1946-02-06) female presenting at the request of .Claudene Pellet, MD for evaluation of heel ulceration.  Last month, the patient was admitted with elevated CK, early rhabdomyolysis after a fall at home with prolonged downtime.  The pt is *** on a statin for cholesterol management.  The pt is *** on a daily aspirin .   Other AC:  *** The pt is *** on medication for hypertension.   The pt is *** diabetic.  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   Procedure: TOTAL HIP ARTHROPLASTY ANTERIOR APPROACH;  Surgeon: Melodi Lerner, MD;  Location: [facility];  Service: Orthopedics;  Laterality: Right;    TOTAL KNEE ARTHROPLASTY Right     Social History   Socioeconomic History   Marital status: Widowed    Spouse name: Not on file   Number of children: Not on file   Years of education: Not on file   Highest education level: Not on file  Occupational History   Not on file  Tobacco Use   Smoking status: Never   Smokeless tobacco: Never  Vaping Use   Vaping status: Never Used  Substance and Sexual Activity   Alcohol use: Not Currently   Drug use: Not Currently   Sexual activity: Not on file  Other Topics Concern   Not on file  Social History Narrative   Not on file   Social Drivers of Health   Financial Resource Strain: Not on file  Food Insecurity:  No Food Insecurity (12/13/2023)   Hunger Vital Sign    Worried About Running Out of Food in the Last Year: Never true    Ran Out of Food in the Last Year: Never true  Transportation Needs: No Transportation Needs (12/13/2023)   PRAPARE - Administrator, Civil Service (Medical): No    Lack of Transportation (Non-Medical): No  Physical Activity: Not on file  Stress: Not on file  Social Connections: Socially Isolated (12/13/2023)   Social Connection and Isolation Panel    Frequency of Communication with Friends and Family: Once a week    Frequency of Social Gatherings with Friends and Family: Once a week    Attends Religious Services: Never    Database Administrator or Organizations: No    Attends Banker Meetings: Never    Marital Status: Widowed  Intimate Partner Violence: Not At Risk (12/13/2023)   Humiliation, Afraid, Rape, and Kick questionnaire    Fear of Current or Ex-Partner: No    Emotionally Abused: No    Physically Abused: No    Sexually Abused: No   *** Family History  Problem Relation Age of Onset   Hypertension Mother    Dementia Mother  Heart disease Father    Lung cancer Father    Heart disease Other    Diabetes Other    Cancer Other    Arthritis Other     Current Outpatient Medications  Medication Sig Dispense Refill   acetaminophen  (TYLENOL ) 325 MG tablet Take 2 tablets (650 mg total) by mouth every 6 (six) hours as needed for mild pain (pain score 1-3) or headache (or Fever >/= 101).     atorvastatin  (LIPITOR) 40 MG tablet Take 40 mg by mouth at bedtime.     olmesartan (BENICAR) 20 MG tablet Take 20 mg by mouth daily.     potassium chloride  (KLOR-CON ) 8 MEQ tablet Take 24 mEq by mouth daily.     senna-docusate (SENOKOT-S) 8.6-50 MG tablet Take 1 tablet by mouth 2 (two) times daily between meals as needed for mild constipation.     No current facility-administered medications for this visit.    No Known Allergies   REVIEW OF  SYSTEMS:  *** [X]  denotes positive finding, [ ]  denotes negative finding Cardiac  Comments:  Chest pain or chest pressure:    Shortness of breath upon exertion:    Short of breath when lying flat:    Irregular heart rhythm:        Vascular    Pain in calf, thigh, or hip brought on by ambulation:    Pain in feet at night that wakes you up from your sleep:     Blood clot in your veins:    Leg swelling:         Pulmonary    Oxygen at home:    Productive cough:     Wheezing:         Neurologic    Sudden weakness in arms or legs:     Sudden numbness in arms or legs:     Sudden onset of difficulty speaking or slurred speech:    Temporary loss of vision in one eye:     Problems with dizziness:         Gastrointestinal    Blood in stool:     Vomited blood:         Genitourinary    Burning when urinating:     Blood in urine:        Psychiatric    Major depression:         Hematologic    Bleeding problems:    Problems with blood clotting too easily:        Skin    Rashes or ulcers:        Constitutional    Fever or chills:      PHYSICAL EXAMINATION:  There were no vitals filed for this visit.  General:  WDWN in NAD; vital signs documented above Gait: Not observed HENT: WNL, normocephalic Pulmonary: normal non-labored breathing , without wheezing Cardiac: {Desc; regular/irreg:14544} HR Abdomen: soft, NT, no masses Skin: {With/Without:20273} rashes Vascular Exam/Pulses:  Right Left  Radial {Exam; arterial pulse strength 0-4:30167} {Exam; arterial pulse strength 0-4:30167}  Ulnar {Exam; arterial pulse strength 0-4:30167} {Exam; arterial pulse strength 0-4:30167}  Femoral {Exam; arterial pulse strength 0-4:30167} {Exam; arterial pulse strength 0-4:30167}  Popliteal {Exam; arterial pulse strength 0-4:30167} {Exam; arterial pulse strength 0-4:30167}  DP {Exam; arterial pulse strength 0-4:30167} {Exam; arterial pulse strength 0-4:30167}  PT {Exam; arterial pulse  strength 0-4:30167} {Exam; arterial pulse strength 0-4:30167}   Extremities: {With/Without:20273} ischemic changes, {With/Without:20273} Gangrene , {With/Without:20273} cellulitis; {With/Without:20273} open wounds;  Musculoskeletal: no muscle wasting  or atrophy  Neurologic: A&O X 3;  No focal weakness or paresthesias are detected Psychiatric:  The pt has {Desc; normal/abnormal:11317::Normal} affect.   Non-Invasive Vascular Imaging:   ***    ASSESSMENT/PLAN: KELSEA MOUSEL is a 78 y.o. female presenting with ***   ***   Fonda FORBES Rim, MD [facility] [phone]

## 2024-01-16 ENCOUNTER — Ambulatory Visit (HOSPITAL_COMMUNITY): Admission: RE | Admit: 2024-01-16 | Discharge: 2024-01-16 | Attending: Surgery | Admitting: Surgery

## 2024-01-16 DIAGNOSIS — M79606 Pain in leg, unspecified: Secondary | ICD-10-CM | POA: Diagnosis present

## 2024-01-16 LAB — VAS US ABI WITH/WO TBI
Left ABI: 1.43
Right ABI: 1.19

## 2024-01-17 ENCOUNTER — Encounter: Payer: Self-pay | Admitting: Vascular Surgery

## 2024-01-17 ENCOUNTER — Ambulatory Visit: Attending: Vascular Surgery | Admitting: Vascular Surgery

## 2024-01-17 VITALS — BP 104/67 | HR 80 | Temp 97.6°F | Resp 20 | Ht 64.0 in | Wt 179.0 lb

## 2024-01-17 DIAGNOSIS — S91302A Unspecified open wound, left foot, initial encounter: Secondary | ICD-10-CM

## 2024-01-17 DIAGNOSIS — S91301A Unspecified open wound, right foot, initial encounter: Secondary | ICD-10-CM

## 2024-01-22 ENCOUNTER — Encounter: Payer: Self-pay | Admitting: Registered Nurse

## 2024-01-23 ENCOUNTER — Other Ambulatory Visit: Payer: Self-pay | Admitting: Registered Nurse

## 2024-01-23 DIAGNOSIS — L8990 Pressure ulcer of unspecified site, unspecified stage: Secondary | ICD-10-CM

## 2024-01-24 ENCOUNTER — Other Ambulatory Visit (HOSPITAL_COMMUNITY): Payer: Self-pay

## 2024-01-24 DIAGNOSIS — L8990 Pressure ulcer of unspecified site, unspecified stage: Secondary | ICD-10-CM

## 2024-01-29 ENCOUNTER — Ambulatory Visit (HOSPITAL_COMMUNITY)

## 2024-01-29 ENCOUNTER — Encounter (HOSPITAL_COMMUNITY): Payer: Self-pay
